# Patient Record
Sex: Female | Born: 1945 | Race: White | Hispanic: No | Smoking: Former smoker
Health system: Southern US, Community
[De-identification: ages and names within clinical notes are randomized; demographics above are authoritative.]

## PROBLEM LIST (undated history)

## (undated) DIAGNOSIS — Z720 Tobacco use: Secondary | ICD-10-CM

## (undated) DIAGNOSIS — I10 Essential (primary) hypertension: Secondary | ICD-10-CM

## (undated) DIAGNOSIS — F419 Anxiety disorder, unspecified: Secondary | ICD-10-CM

## (undated) DIAGNOSIS — F32A Depression, unspecified: Secondary | ICD-10-CM

## (undated) DIAGNOSIS — K219 Gastro-esophageal reflux disease without esophagitis: Secondary | ICD-10-CM

## (undated) DIAGNOSIS — J449 Chronic obstructive pulmonary disease, unspecified: Secondary | ICD-10-CM

## (undated) DIAGNOSIS — M1990 Unspecified osteoarthritis, unspecified site: Secondary | ICD-10-CM

## (undated) DIAGNOSIS — E041 Nontoxic single thyroid nodule: Secondary | ICD-10-CM

## (undated) DIAGNOSIS — I739 Peripheral vascular disease, unspecified: Secondary | ICD-10-CM

## (undated) HISTORY — DX: Essential (primary) hypertension: I10

## (undated) HISTORY — DX: Peripheral vascular disease, unspecified: I73.9

## (undated) HISTORY — DX: Tobacco use: Z72.0

---

## 1997-11-16 ENCOUNTER — Other Ambulatory Visit: Admission: RE | Admit: 1997-11-16 | Discharge: 1997-11-16 | Payer: Self-pay | Admitting: Obstetrics & Gynecology

## 1998-06-25 ENCOUNTER — Ambulatory Visit (HOSPITAL_COMMUNITY): Admission: RE | Admit: 1998-06-25 | Discharge: 1998-06-25 | Payer: Self-pay | Admitting: Family Medicine

## 1999-10-13 ENCOUNTER — Emergency Department (HOSPITAL_COMMUNITY): Admission: EM | Admit: 1999-10-13 | Discharge: 1999-10-14 | Payer: Self-pay | Admitting: Emergency Medicine

## 1999-10-27 ENCOUNTER — Other Ambulatory Visit: Admission: RE | Admit: 1999-10-27 | Discharge: 1999-10-27 | Payer: Self-pay | Admitting: Obstetrics & Gynecology

## 2000-01-20 ENCOUNTER — Encounter: Payer: Self-pay | Admitting: Family Medicine

## 2000-01-20 ENCOUNTER — Ambulatory Visit (HOSPITAL_COMMUNITY): Admission: RE | Admit: 2000-01-20 | Discharge: 2000-01-20 | Payer: Self-pay | Admitting: Family Medicine

## 2000-01-21 ENCOUNTER — Ambulatory Visit (HOSPITAL_COMMUNITY): Admission: RE | Admit: 2000-01-21 | Discharge: 2000-01-21 | Payer: Self-pay | Admitting: Family Medicine

## 2000-01-21 ENCOUNTER — Encounter: Payer: Self-pay | Admitting: Family Medicine

## 2000-11-17 ENCOUNTER — Other Ambulatory Visit: Admission: RE | Admit: 2000-11-17 | Discharge: 2000-11-17 | Payer: Self-pay | Admitting: Obstetrics & Gynecology

## 2002-02-04 ENCOUNTER — Other Ambulatory Visit: Admission: RE | Admit: 2002-02-04 | Discharge: 2002-02-04 | Payer: Self-pay | Admitting: Obstetrics and Gynecology

## 2002-03-14 HISTORY — PX: TRANSTHORACIC ECHOCARDIOGRAM: SHX275

## 2003-02-07 ENCOUNTER — Other Ambulatory Visit: Admission: RE | Admit: 2003-02-07 | Discharge: 2003-02-07 | Payer: Self-pay | Admitting: Obstetrics & Gynecology

## 2004-02-17 ENCOUNTER — Other Ambulatory Visit: Admission: RE | Admit: 2004-02-17 | Discharge: 2004-02-17 | Payer: Self-pay | Admitting: Obstetrics & Gynecology

## 2005-03-01 ENCOUNTER — Other Ambulatory Visit: Admission: RE | Admit: 2005-03-01 | Discharge: 2005-03-01 | Payer: Self-pay | Admitting: Obstetrics & Gynecology

## 2006-06-23 HISTORY — PX: OTHER SURGICAL HISTORY: SHX169

## 2007-07-31 ENCOUNTER — Ambulatory Visit (HOSPITAL_COMMUNITY): Admission: RE | Admit: 2007-07-31 | Discharge: 2007-07-31 | Payer: Self-pay | Admitting: Obstetrics & Gynecology

## 2007-07-31 ENCOUNTER — Encounter (INDEPENDENT_AMBULATORY_CARE_PROVIDER_SITE_OTHER): Payer: Self-pay | Admitting: Obstetrics & Gynecology

## 2007-11-16 ENCOUNTER — Encounter: Admission: RE | Admit: 2007-11-16 | Discharge: 2007-11-16 | Payer: Self-pay | Admitting: Obstetrics & Gynecology

## 2010-05-24 HISTORY — PX: OTHER SURGICAL HISTORY: SHX169

## 2010-12-05 ENCOUNTER — Encounter: Payer: Self-pay | Admitting: Obstetrics & Gynecology

## 2011-03-03 ENCOUNTER — Other Ambulatory Visit: Payer: Self-pay | Admitting: Obstetrics & Gynecology

## 2011-03-03 DIAGNOSIS — R928 Other abnormal and inconclusive findings on diagnostic imaging of breast: Secondary | ICD-10-CM

## 2011-03-07 ENCOUNTER — Ambulatory Visit
Admission: RE | Admit: 2011-03-07 | Discharge: 2011-03-07 | Disposition: A | Discharge status: Home / Self Care | Payer: BC Managed Care – PPO | Source: Ambulatory Visit | Attending: Obstetrics & Gynecology | Admitting: Obstetrics & Gynecology

## 2011-03-07 DIAGNOSIS — R928 Other abnormal and inconclusive findings on diagnostic imaging of breast: Secondary | ICD-10-CM

## 2011-03-29 NOTE — Op Note (Signed)
[name], Kim Higgins NO.:  [id number]   MEDICAL RECORD NO.:  [id number]          PATIENT TYPE:  AMB   LOCATION:  SDC                           FACILITY:  [facility]   PHYSICIAN:  Freddy Finner, M.D.   DATE OF BIRTH:  12-24-1945   DATE OF PROCEDURE:  DATE OF DISCHARGE:                               OPERATIVE REPORT   PREOPERATIVE DIAGNOSES:  Postmenopausal bleeding, endometrial polyp.   POSTOPERATIVE DIAGNOSES:  Postmenopausal bleeding, endometrial polyp.   OPERATIVE PROCEDURE:  Hysteroscopy, D&C and removal of endometrial  polyp.   ANESTHESIA:  General.   INTRAOPERATIVE COMPLICATIONS:  None.   SECONDARY ANESTHESIA:  Paracervical block and IM and IV Toradol.   ESTIMATED BLOOD LOSS:  5 mL.   FLUIDS:  Sorbitol deficit of 45 mL.   COMPLICATIONS:  None.   PROCEDURE IN DETAIL:  The patient is a 65 year old who has had  postmenopausal bleeding and is on hormone replacement therapy.  A  sonohysterogram in the office showed an endometrial polyp.  She is  admitted now for hysteroscopy and D&C.  She was admitted on the morning  of surgery, given a bolus of Ancef IV preoperatively, brought to the  operating room, placed under adequate general anesthesia, placed in the  dorsal lithotomy position using the [facility] stirrup system.  Betadine prep  of the perineum and vagina was carried out in the standard fashion.  Sterile drapes were applied.  Bivalve speculum was introduced.  The  cervix was visualized, grasped on the anterior left with a single tooth  tenaculum.  Paracervical block was placed with injection of  approximately 5 mL at the 4 and 8 o'clock in the vaginal fornices at a  depth of approximately 5 mm along the uterosacral ligaments.  Uterus  sounded to 7 cm.  The cervix was dilated up to 25 with [facility] dilators.  A 12.5 degree hysteroscope was introduced using 3% sorbitol as  distending medium.  Polyp was visualized, photographed.  Gentle thorough  curettage and  exploration we removed the polyp and adequately sampled  the endometrium as confirmed by repeat inspection with the hysteroscope.  The procedure was terminated.  The instruments were removed.  The  patient was awakened and taken  to recovery in good condition.  She will be given routine outpatient  surgical instructions.  She has Vicodin to be taken as needed for  postoperative cramping and pain.  She is to return to the office in  approximately 7 to 10 days for postoperative followup.      Freddy Finner, M.D.  Electronically Signed     WRN/MEDQ  D:  07/31/2007  T:  07/31/2007  Job:  [id number]

## 2011-05-27 HISTORY — PX: OTHER SURGICAL HISTORY: SHX169

## 2011-08-25 LAB — BASIC METABOLIC PANEL
BUN: 12
CO2: 29
Chloride: 104
GFR calc non Af Amer: >60
Glucose, Bld: 104 — ABNORMAL HIGH
Potassium: 4.6
Sodium: 138

## 2011-08-25 LAB — CBC
HCT: 39.9
Hemoglobin: 14
MCV: 97.2
RDW: 13.6

## 2012-10-30 ENCOUNTER — Other Ambulatory Visit (HOSPITAL_COMMUNITY): Payer: Self-pay | Admitting: Cardiovascular Disease

## 2012-10-30 DIAGNOSIS — I739 Peripheral vascular disease, unspecified: Secondary | ICD-10-CM

## 2013-03-22 DIAGNOSIS — H25049 Posterior subcapsular polar age-related cataract, unspecified eye: Secondary | ICD-10-CM | POA: Diagnosis not present

## 2013-03-22 DIAGNOSIS — H52209 Unspecified astigmatism, unspecified eye: Secondary | ICD-10-CM | POA: Diagnosis not present

## 2013-03-22 DIAGNOSIS — H2510 Age-related nuclear cataract, unspecified eye: Secondary | ICD-10-CM | POA: Diagnosis not present

## 2013-04-02 DIAGNOSIS — M549 Dorsalgia, unspecified: Secondary | ICD-10-CM | POA: Diagnosis not present

## 2013-05-24 ENCOUNTER — Ambulatory Visit (HOSPITAL_COMMUNITY)
Admission: RE | Admit: 2013-05-24 | Discharge: 2013-05-24 | Disposition: A | Discharge status: Home / Self Care | Payer: Medicare Other | Source: Ambulatory Visit | Attending: Cardiovascular Disease | Admitting: Cardiovascular Disease

## 2013-05-24 DIAGNOSIS — I739 Peripheral vascular disease, unspecified: Secondary | ICD-10-CM | POA: Diagnosis not present

## 2013-05-24 NOTE — Progress Notes (Signed)
Upper Extremity Arterial Duplex Completed. °Kim Higgins ° °

## 2013-05-28 DIAGNOSIS — H2589 Other age-related cataract: Secondary | ICD-10-CM | POA: Diagnosis not present

## 2013-05-28 DIAGNOSIS — H25049 Posterior subcapsular polar age-related cataract, unspecified eye: Secondary | ICD-10-CM | POA: Diagnosis not present

## 2013-05-28 DIAGNOSIS — H2510 Age-related nuclear cataract, unspecified eye: Secondary | ICD-10-CM | POA: Diagnosis not present

## 2013-06-03 ENCOUNTER — Telehealth: Payer: Self-pay | Admitting: *Deleted

## 2013-06-03 ENCOUNTER — Encounter: Payer: Self-pay | Admitting: *Deleted

## 2013-06-03 DIAGNOSIS — I739 Peripheral vascular disease, unspecified: Secondary | ICD-10-CM

## 2013-06-03 NOTE — Telephone Encounter (Signed)
Message copied by Marella Bile on Mon Jun 03, 2013  2:02 PM ------      Message from: [name]      Created: Thu May 30, 2013 11:00 AM       No change from prior study. Repeat in 24 months. ------

## 2013-07-22 DIAGNOSIS — Z1231 Encounter for screening mammogram for malignant neoplasm of breast: Secondary | ICD-10-CM | POA: Diagnosis not present

## 2013-07-22 DIAGNOSIS — Z124 Encounter for screening for malignant neoplasm of cervix: Secondary | ICD-10-CM | POA: Diagnosis not present

## 2013-07-22 DIAGNOSIS — Z1212 Encounter for screening for malignant neoplasm of rectum: Secondary | ICD-10-CM | POA: Diagnosis not present

## 2013-07-24 DIAGNOSIS — H35359 Cystoid macular degeneration, unspecified eye: Secondary | ICD-10-CM | POA: Diagnosis not present

## 2013-09-11 DIAGNOSIS — H35359 Cystoid macular degeneration, unspecified eye: Secondary | ICD-10-CM | POA: Diagnosis not present

## 2013-09-17 DIAGNOSIS — J209 Acute bronchitis, unspecified: Secondary | ICD-10-CM | POA: Diagnosis not present

## 2013-09-17 DIAGNOSIS — F17200 Nicotine dependence, unspecified, uncomplicated: Secondary | ICD-10-CM | POA: Diagnosis not present

## 2013-10-01 DIAGNOSIS — R0602 Shortness of breath: Secondary | ICD-10-CM | POA: Diagnosis not present

## 2013-10-01 DIAGNOSIS — I10 Essential (primary) hypertension: Secondary | ICD-10-CM | POA: Diagnosis not present

## 2013-10-03 DIAGNOSIS — I10 Essential (primary) hypertension: Secondary | ICD-10-CM | POA: Diagnosis not present

## 2013-10-22 DIAGNOSIS — R0602 Shortness of breath: Secondary | ICD-10-CM | POA: Diagnosis not present

## 2013-10-28 ENCOUNTER — Other Ambulatory Visit: Payer: Self-pay | Admitting: *Deleted

## 2013-10-28 MED ORDER — IRBESARTAN 150 MG PO TABS: 150.0000 mg | ORAL_TABLET | Freq: Every day | ORAL | Status: DC

## 2013-10-28 NOTE — Telephone Encounter (Signed)
Rx was sent to pharmacy electronically. 

## 2013-11-01 DIAGNOSIS — J019 Acute sinusitis, unspecified: Secondary | ICD-10-CM | POA: Diagnosis not present

## 2013-11-01 DIAGNOSIS — J449 Chronic obstructive pulmonary disease, unspecified: Secondary | ICD-10-CM | POA: Diagnosis not present

## 2014-01-01 DIAGNOSIS — J449 Chronic obstructive pulmonary disease, unspecified: Secondary | ICD-10-CM | POA: Diagnosis not present

## 2014-01-01 DIAGNOSIS — F3289 Other specified depressive episodes: Secondary | ICD-10-CM | POA: Diagnosis not present

## 2014-01-01 DIAGNOSIS — L659 Nonscarring hair loss, unspecified: Secondary | ICD-10-CM | POA: Diagnosis not present

## 2014-01-01 DIAGNOSIS — Z23 Encounter for immunization: Secondary | ICD-10-CM | POA: Diagnosis not present

## 2014-01-01 DIAGNOSIS — Z0000 Encounter for general adult medical examination without abnormal findings: Secondary | ICD-10-CM | POA: Diagnosis not present

## 2014-01-01 DIAGNOSIS — F329 Major depressive disorder, single episode, unspecified: Secondary | ICD-10-CM | POA: Diagnosis not present

## 2014-01-01 DIAGNOSIS — I10 Essential (primary) hypertension: Secondary | ICD-10-CM | POA: Diagnosis not present

## 2014-04-02 DIAGNOSIS — H35359 Cystoid macular degeneration, unspecified eye: Secondary | ICD-10-CM | POA: Diagnosis not present

## 2014-04-02 DIAGNOSIS — H43819 Vitreous degeneration, unspecified eye: Secondary | ICD-10-CM | POA: Diagnosis not present

## 2014-05-05 ENCOUNTER — Other Ambulatory Visit: Payer: Self-pay | Admitting: Cardiovascular Disease

## 2014-05-06 NOTE — Telephone Encounter (Signed)
Rx refill sent to patient pharmacy with inst. To make appointment for further refills.

## 2014-06-23 ENCOUNTER — Other Ambulatory Visit: Payer: Self-pay | Admitting: Cardiovascular Disease

## 2014-06-24 ENCOUNTER — Encounter: Payer: Self-pay | Admitting: *Deleted

## 2014-06-24 NOTE — Telephone Encounter (Signed)
Rx was sent to pharmacy electronically. Last OV 03/07/13

## 2014-06-26 ENCOUNTER — Other Ambulatory Visit: Payer: Self-pay | Admitting: *Deleted

## 2014-06-26 MED ORDER — IRBESARTAN 150 MG PO TABS: 150.0000 mg | ORAL_TABLET | Freq: Every day | ORAL | Status: DC

## 2014-07-02 DIAGNOSIS — J209 Acute bronchitis, unspecified: Secondary | ICD-10-CM | POA: Diagnosis not present

## 2014-07-02 DIAGNOSIS — J449 Chronic obstructive pulmonary disease, unspecified: Secondary | ICD-10-CM | POA: Diagnosis not present

## 2014-07-02 DIAGNOSIS — F17200 Nicotine dependence, unspecified, uncomplicated: Secondary | ICD-10-CM | POA: Diagnosis not present

## 2014-07-02 DIAGNOSIS — I10 Essential (primary) hypertension: Secondary | ICD-10-CM | POA: Diagnosis not present

## 2014-07-15 ENCOUNTER — Encounter: Payer: Self-pay | Admitting: *Deleted

## 2014-07-16 ENCOUNTER — Encounter: Payer: Self-pay | Admitting: Cardiology

## 2014-07-16 ENCOUNTER — Ambulatory Visit (INDEPENDENT_AMBULATORY_CARE_PROVIDER_SITE_OTHER): Payer: Medicare Other | Admitting: Cardiology

## 2014-07-16 VITALS — BP 144/74 | HR 71 | Ht 65.0 in | Wt 121.5 lb

## 2014-07-16 DIAGNOSIS — I739 Peripheral vascular disease, unspecified: Secondary | ICD-10-CM | POA: Insufficient documentation

## 2014-07-16 DIAGNOSIS — F17200 Nicotine dependence, unspecified, uncomplicated: Secondary | ICD-10-CM | POA: Diagnosis not present

## 2014-07-16 DIAGNOSIS — I10 Essential (primary) hypertension: Secondary | ICD-10-CM | POA: Diagnosis not present

## 2014-07-16 MED ORDER — IRBESARTAN 150 MG PO TABS: 150.0000 mg | ORAL_TABLET | Freq: Every day | ORAL | Status: DC

## 2014-07-16 MED ORDER — AMLODIPINE BESYLATE 2.5 MG PO TABS: 2.5000 mg | ORAL_TABLET | Freq: Every day | ORAL | Status: DC

## 2014-07-16 NOTE — Assessment & Plan Note (Addendum)
1/4 pk a day or less

## 2014-07-16 NOTE — Progress Notes (Signed)
07/16/2014 Kim Higgins   November 21, 1945  [id number]  Primary Physician Cloyd Stagers, MD Primary Cardiologist: Dr Gwenlyn Found  HPI:  68 y/o female followed by Dr Melvern Banker previously, and Dr Gwenlyn Found since 2010. 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 She had an upper extremity doppler in 2010 that suggested significant Lt SCA stenosis. She had no symptoms and her last doppler in 2012 suggested mild stenosis, < 50%.  She is here for a routine visit. She has a new PCP- Dr Collier Flowers. The pt reports that her B/P has been elevated recently, systolic > 295. She denies any Lt arm pain, SOB, or chest pain.   Current Outpatient Prescriptions  Medication Sig Dispense Refill  . albuterol-ipratropium (COMBIVENT) 18-103 MCG/ACT inhaler Inhale 1 puff into the lungs as needed for wheezing or shortness of breath.      . Ascorbic Acid (VITAMIN C) 1000 MG tablet Take 1,000 mg by mouth daily.      Marland Kitchen b complex vitamins tablet Take 1 tablet by mouth daily.      . Biotin 10 MG CAPS Take 1 capsule by mouth daily.      . Black Currant Seed Oil 500 MG CAPS Take 1 capsule by mouth daily.      Marland Kitchen buPROPion (WELLBUTRIN SR) 150 MG 12 hr tablet Take 150 mg by mouth 2 (two) times daily.      . Cholecalciferol (VITAMIN D-3) 1000 UNITS CAPS Take 1 capsule by mouth daily.      Marland Kitchen estradiol (ESTRACE) 2 MG tablet Take 2 mg by mouth daily.      . Fluticasone-Salmeterol (ADVAIR) 250-50 MCG/DOSE AEPB Inhale 1 puff into the lungs 2 (two) times daily.      . irbesartan (AVAPRO) 150 MG tablet Take 1 tablet (150 mg total) by mouth daily.  30 tablet  5  . Lycopene 10 MG CAPS Take 1 capsule by mouth daily.      . Omega-3 Fatty Acids (FISH OIL) 1200 MG CAPS Take 1 capsule by mouth daily.      Marland Kitchen omeprazole (PRILOSEC) 20 MG capsule Take 20 mg by mouth daily.      . progesterone (PROMETRIUM) 100 MG capsule Take 100 mg by mouth daily. Takes  for first 10 days in every other month      . tiotropium (SPIRIVA HANDIHALER) 18 MCG inhalation capsule Place 18 mcg into inhaler and inhale daily.      . vitamin E 400 UNIT capsule Take 400 Units by mouth daily.      Marland Kitchen amLODipine (NORVASC) 2.5 MG tablet Take 1 tablet (2.5 mg total) by mouth daily.  30 tablet  11   No current facility-administered medications for this visit.    No Known Allergies  History   Social History  . Marital Status: Single    Spouse Name: N/A    Number of Children: N/A  . Years of Education: N/A   Occupational History  . Not on file.   Social History Main Topics  . Smoking status: Current Every Day Smoker -- 0.25 packs/day for 30 years    Types: Cigarettes  . Smokeless tobacco: Not on file  . Alcohol Use: 2.0 oz/week    4 drink(s) per week  . Drug Use: Not on file  . Sexual Activity: Not on file   Other Topics Concern  . Not on file  Social History Narrative  . No narrative on file     Review of Systems: General: negative for chills, fever, night sweats or weight changes.  Cardiovascular: negative for chest pain, dyspnea on exertion, edema, orthopnea, palpitations, paroxysmal nocturnal dyspnea or shortness of breath Dermatological: negative for rash Respiratory: negative for cough or wheezing Urologic: negative for hematuria Abdominal: negative for nausea, vomiting, diarrhea, bright red blood per rectum, melena, or hematemesis Neurologic: negative for visual changes, syncope, or dizziness All other systems reviewed and are otherwise negative except as noted above.    Blood pressure 144/74, pulse 71, height 5\' 5"  (1.651 m), weight 121 lb 8 oz (55.112 kg).  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 She is very reluctant to take addition medication but I got her to agree to try Norvasc 2.5 mg. I discussed her LSCA stenosis with Dr Gwenlyn Found. As she is asymptomatic we will not order a follow up. I did suggest she take a baby ASA daily. Her last LDL was 63 in 2013. Ideally I would consider a low dose statin for plaque stabilization but I could tell she would not be open to this. She'll return in 3 weeks for a B/P check and at that time we'll tackle smoking cessation.   Kadajah Higgins KPA-C 07/16/2014 4:59 PM

## 2014-07-16 NOTE — Patient Instructions (Signed)
Start a daily Aspirin 81mg  Start Amlodipine 2.5mg  Daily   Your physician recommends that you schedule a follow-up appointment in: 3 weeks with Kerin Ransom PA-C

## 2014-07-16 NOTE — Assessment & Plan Note (Signed)
asymptomatic

## 2014-07-16 NOTE — Assessment & Plan Note (Signed)
Her B/P has been running high with systolic > 128

## 2014-07-29 DIAGNOSIS — Z01419 Encounter for gynecological examination (general) (routine) without abnormal findings: Secondary | ICD-10-CM | POA: Diagnosis not present

## 2014-07-29 DIAGNOSIS — Z1231 Encounter for screening mammogram for malignant neoplasm of breast: Secondary | ICD-10-CM | POA: Diagnosis not present

## 2014-08-11 ENCOUNTER — Ambulatory Visit (INDEPENDENT_AMBULATORY_CARE_PROVIDER_SITE_OTHER): Payer: Medicare Other | Admitting: Cardiology

## 2014-08-11 ENCOUNTER — Encounter: Payer: Self-pay | Admitting: Cardiology

## 2014-08-11 VITALS — BP 106/62 | HR 74 | Ht 64.0 in | Wt 120.5 lb

## 2014-08-11 DIAGNOSIS — I739 Peripheral vascular disease, unspecified: Secondary | ICD-10-CM

## 2014-08-11 DIAGNOSIS — F17200 Nicotine dependence, unspecified, uncomplicated: Secondary | ICD-10-CM | POA: Diagnosis not present

## 2014-08-11 DIAGNOSIS — I10 Essential (primary) hypertension: Secondary | ICD-10-CM | POA: Diagnosis not present

## 2014-08-11 NOTE — Assessment & Plan Note (Signed)
1/4 pk a day

## 2014-08-11 NOTE — Assessment & Plan Note (Signed)
No symptoms 

## 2014-08-11 NOTE — Progress Notes (Signed)
08/11/2014 Kim Higgins   Jul 22, 1946  [id number]  Primary Physicia Cloyd Stagers, MD Primary Cardiologist: Dr Gwenlyn Found  HPI:  68 y/o female followed by Dr Melvern Banker previously, and Dr Gwenlyn Found since 2010. 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 She had an upper extremity doppler in 2010 that suggested significant Lt SCA stenosis. She had no symptoms and her last doppler in 2012 suggested mild stenosis, < 50%. She saw me 07/16/14 as a referral from her new PCP- Dr Collier Flowers. The pt reports that her B/P had been elevated recently, systolic > 419. She denies any Lt arm pain, SOB, or chest pain. I added Norvasc 2.5 mg daily and she is here today for a follow up B/P.     Current Outpatient Prescriptions  Medication Sig Dispense Refill  . albuterol-ipratropium (COMBIVENT) 18-103 MCG/ACT inhaler Inhale 1 puff into the lungs as needed for wheezing or shortness of breath.      Marland Kitchen amLODipine (NORVASC) 2.5 MG tablet Take 1 tablet (2.5 mg total) by mouth daily.  30 tablet  11  . Ascorbic Acid (VITAMIN C) 1000 MG tablet Take 1,000 mg by mouth daily.      Marland Kitchen b complex vitamins tablet Take 1 tablet by mouth daily.      . Biotin 10 MG CAPS Take 1 capsule by mouth daily.      . Black Currant Seed Oil 500 MG CAPS Take 1 capsule by mouth daily.      Marland Kitchen buPROPion (WELLBUTRIN SR) 150 MG 12 hr tablet Take 150 mg by mouth 2 (two) times daily.      . Cholecalciferol (VITAMIN D-3) 1000 UNITS CAPS Take 1 capsule by mouth daily.      Marland Kitchen estradiol (ESTRACE) 2 MG tablet Take 2 mg by mouth daily.      . Fluticasone-Salmeterol (ADVAIR) 250-50 MCG/DOSE AEPB Inhale 1 puff into the lungs 2 (two) times daily.      . irbesartan (AVAPRO) 150 MG tablet Take 1 tablet (150 mg total) by mouth daily.  30 tablet  5  . Lycopene 10 MG CAPS Take 1 capsule by mouth daily.      . Omega-3 Fatty Acids (FISH OIL) 1200 MG CAPS Take 1  capsule by mouth daily.      Marland Kitchen omeprazole (PRILOSEC) 20 MG capsule Take 20 mg by mouth daily.      . progesterone (PROMETRIUM) 100 MG capsule Take 100 mg by mouth daily. Takes for first 10 days in every other month      . tiotropium (SPIRIVA HANDIHALER) 18 MCG inhalation capsule Place 18 mcg into inhaler and inhale daily.      . vitamin E 400 UNIT capsule Take 400 Units by mouth daily.       No current facility-administered medications for this visit.    No Known Allergies  History   Social History  . Marital Status: Single    Spouse Name: N/A    Number of Children: N/A  . Years of Education: N/A   Occupational History  . Not on file.   Social History Main Topics  . Smoking status: Current Every Day Smoker -- 0.25 packs/day for 30 years    Types: Cigarettes  . Smokeless tobacco: Not on file  . Alcohol Use: 2.0 oz/week    4 drink(s) per week  . Drug Use: Not on file  . Sexual  Activity: Not on file   Other Topics Concern  . Not on file   Social History Narrative  . No narrative on file     Review of Systems: General: negative for chills, fever, night sweats or weight changes.  Cardiovascular: negative for chest pain, dyspnea on exertion, edema, orthopnea, palpitations, paroxysmal nocturnal dyspnea or shortness of breath Dermatological: negative for rash Respiratory: negative for cough or wheezing Urologic: negative for hematuria Abdominal: negative for nausea, vomiting, diarrhea, bright red blood per rectum, melena, or hematemesis Neurologic: negative for visual changes, syncope, or dizziness All other systems reviewed and are otherwise negative except as noted above.    Blood pressure 106/62, pulse 74, height 5\' 4"  (1.626 m), weight 120 lb 8 oz (54.658 kg).  General appearance: alert, cooperative, appears older than stated age, cachectic and no distress B/P in her Lt arm 82 sytolic by palpation. B/P in her Rt ar 92/60 by cuff.    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 I suggested she take her Avapro at night. She'll keep a record of her B/P at home and let us know how she is doing. We'll check a B/P in a month. If she has orthostatic symptoms or a systolic B/P consistently less than 100 I would probably stop her Amlodipine and settle for slightly higher B/P.   Kim Higgins KPA-C 08/11/2014 12:04 PM

## 2014-08-11 NOTE — Patient Instructions (Signed)
Continue same medications   Your physician recommends that you schedule a follow-up appointment in: 1 month for B/P check Wed 09/10/14 3:50 pm Kim Higgins

## 2014-08-11 NOTE — Assessment & Plan Note (Signed)
Now a little low after addition of low dose Amlodipine

## 2014-08-13 ENCOUNTER — Ambulatory Visit: Payer: Medicare Other | Admitting: Cardiovascular Disease

## 2014-09-10 ENCOUNTER — Encounter: Payer: Self-pay | Admitting: Pharmacist Clinician (PhC)/ Clinical Pharmacy Specialist

## 2014-09-10 ENCOUNTER — Ambulatory Visit (INDEPENDENT_AMBULATORY_CARE_PROVIDER_SITE_OTHER): Payer: Medicare Other | Admitting: Pharmacist Clinician (PhC)/ Clinical Pharmacy Specialist

## 2014-09-10 VITALS — BP 132/84 | HR 72 | Ht 64.0 in | Wt 122.9 lb

## 2014-09-10 DIAGNOSIS — I10 Essential (primary) hypertension: Secondary | ICD-10-CM | POA: Diagnosis not present

## 2014-09-10 NOTE — Progress Notes (Signed)
09/10/2014 PSALM ARMAN [birth year] [id number]   HPI:  Kim Higgins is a 68 y.o. female patient of Dr. Gwenlyn Found, with a PMH below who presents today for hypertension clinic evaluation.  Her cardiac history is significant for PVD and hypertension.  She has been taking irbesartan for a year or more, then added amlodipine in early September.  She then started having low pressures, when she saw Kerin Ransom on 9/28 her pressure was 106/62.  He had her switch her irbesartan to evenings to see if this would avoid her daytime low readings.  She reports home readings from 734-193 systolic, with only 2 readings >140.    She is a current smoker, although she would like to quit.  Right now a pack of cigarettes is lasting her close to 1 week.  She drinks 1-2 glasses of wine and cups of coffee each day.  She follows a healthy diet and is very conscientious of salt and packaged foods.  Her exercise consists of walking 20-30 minutes per day and going to the gym at least twice per week.  She spoke of trying to keep her weight steady, not wanting to get "heavy" again.  When asked, she stated that several years ago she weighed 136 lbs and she doesn't want to weigh that much again.     Current Outpatient Prescriptions  Medication Sig Dispense Refill  . albuterol-ipratropium (COMBIVENT) 18-103 MCG/ACT inhaler Inhale 1 puff into the lungs as needed for wheezing or shortness of breath.      Marland Kitchen amLODipine (NORVASC) 2.5 MG tablet Take 1 tablet (2.5 mg total) by mouth daily.  30 tablet  11  . Ascorbic Acid (VITAMIN C) 1000 MG tablet Take 1,000 mg by mouth daily.      Marland Kitchen b complex vitamins tablet Take 1 tablet by mouth daily.      . Biotin 10 MG CAPS Take 1 capsule by mouth daily.      . Black Currant Seed Oil 500 MG CAPS Take 1 capsule by mouth daily.      Marland Kitchen buPROPion (WELLBUTRIN SR) 150 MG 12 hr tablet Take 150 mg by mouth 2 (two) times daily.      . Cholecalciferol (VITAMIN D-3) 1000 UNITS CAPS Take 1 capsule by mouth  daily.      Marland Kitchen estradiol (ESTRACE) 2 MG tablet Take 2 mg by mouth daily.      . Fluticasone-Salmeterol (ADVAIR) 250-50 MCG/DOSE AEPB Inhale 1 puff into the lungs 2 (two) times daily.      . irbesartan (AVAPRO) 150 MG tablet Take 1 tablet (150 mg total) by mouth daily.  30 tablet  5  . Lycopene 10 MG CAPS Take 1 capsule by mouth daily.      . Omega-3 Fatty Acids (FISH OIL) 1200 MG CAPS Take 1 capsule by mouth daily.      Marland Kitchen omeprazole (PRILOSEC) 20 MG capsule Take 20 mg by mouth daily.      . progesterone (PROMETRIUM) 100 MG capsule Take 100 mg by mouth daily. Takes for first 10 days in every other month      . tiotropium (SPIRIVA HANDIHALER) 18 MCG inhalation capsule Place 18 mcg into inhaler and inhale daily.      . vitamin E 400 UNIT capsule Take 400 Units by mouth daily.       No current facility-administered medications for this visit.    No Known Allergies  Past Medical History  Diagnosis Date  . Tobacco abuse   .  Hypertension   . PVD (peripheral vascular disease)     asymptomatic, mild LSCA stenosis    Blood pressure 132/84, pulse 72, height 5\' 4"  (1.626 m), weight 122 lb 14.4 oz (55.747 kg).   ASSESSMENT AND PLAN:  Today her BP looks good at 132/84.  She is feeling good and has no complaints.  I will go ahead and discontinue her amlodipine for now, but I cautioned her not to throw it away just yet.  She is to continue with home BP checks several times each week and I will see her again in 2 months.  I asked that she call in earlier if she sees her pressure trending to >150 on a regular basis, as we will have to restart the amlodipine if this happens.   Tommy Medal PharmD CPP [facility]

## 2014-09-10 NOTE — Patient Instructions (Signed)
Return for a a follow up appointment in 2 months  Your blood pressure today is 132/84  Check your blood pressure at home several times each week and keep record of the readings.  Take your BP meds as follows - continue with irbesartan once daily, discontinue amlodipine for now  Bring all of your meds, your BP cuff and your record of home blood pressures to your next appointment.  Exercise as you're able, try to walk approximately 30 minutes per day.  Keep salt intake to a minimum, especially watch canned and prepared boxed foods.  Eat more fresh fruits and vegetables and fewer canned items.  Avoid eating in fast food restaurants.    HOW TO TAKE YOUR BLOOD PRESSURE:   Rest 5 minutes before taking your blood pressure.    Don't smoke or drink caffeinated beverages for at least 30 minutes before.   Take your blood pressure before (not after) you eat.   Sit comfortably with your back supported and both feet on the floor (don't cross your legs).   Elevate your arm to heart level on a table or a desk.   Use the proper sized cuff. It should fit smoothly and snugly around your bare upper arm. There should be enough room to slip a fingertip under the cuff. The bottom edge of the cuff should be 1 inch above the crease of the elbow.   Ideally, take 3 measurements at one sitting and record the average.

## 2014-10-27 ENCOUNTER — Encounter: Payer: Medicare Other | Admitting: Pharmacist Clinician (PhC)/ Clinical Pharmacy Specialist

## 2014-11-26 ENCOUNTER — Ambulatory Visit (INDEPENDENT_AMBULATORY_CARE_PROVIDER_SITE_OTHER): Payer: Medicare Other | Admitting: Pharmacist Clinician (PhC)/ Clinical Pharmacy Specialist

## 2014-11-26 ENCOUNTER — Encounter: Payer: Self-pay | Admitting: Pharmacist Clinician (PhC)/ Clinical Pharmacy Specialist

## 2014-11-26 VITALS — BP 140/70 | HR 68 | Ht 64.0 in | Wt 123.5 lb

## 2014-11-26 DIAGNOSIS — I10 Essential (primary) hypertension: Secondary | ICD-10-CM

## 2014-11-26 NOTE — Progress Notes (Signed)
11/26/2014 Kim Higgins 1946/05/30 [id number]   HPI:  Kim Higgins is a 69 y.o. female patient of Dr. Gwenlyn Found, with a PMH below who presents today for hypertension clinic follow up.  Her cardiac history is significant for PVD and hypertension.  She has been taking irbesartan for a year or more, then added amlodipine in early September.  She then started having low pressures, when she saw Kerin Ransom on 9/28 her pressure was 106/62.  He had her switch her irbesartan to evenings to see if this would avoid her daytime low readings.  She reports home readings from 831-517 systolic, with only 2 readings >140.   We stopped the amlodipine at that time.  Today she brings in about 15 readings over the past 3 months.  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 They run 108-141/62-83.  Only one reading at 141, all others 135 or lower  On a positive note, she quit smoking sometime last fall.  She states it was in September, but I saw her in late October and she was planning to quit at any time.  Has been on Chantix, only 1 cigarette since then.  She drinks 1-2 glasses of wine and cups of coffee each day.  She follows a healthy diet and is very conscientious of salt and packaged foods.  Her exercise consists of walking 20-30 minutes per day and going to the gym at least twice per week.  She spoke of trying to keep her weight steady, not wanting to get "heavy" again.  When asked, she stated that several years ago she weighed 136 lbs and she doesn't want to weigh that much again.     Current Outpatient Prescriptions  Medication Sig Dispense Refill  . albuterol-ipratropium (COMBIVENT) 18-103 MCG/ACT inhaler Inhale 1 puff into the lungs as needed for wheezing or shortness of breath.    Marland Kitchen amLODipine (NORVASC) 2.5 MG tablet Take 1 tablet (2.5 mg total) by mouth daily. 30 tablet 11  . Ascorbic Acid (VITAMIN C) 1000 MG tablet Take 1,000 mg by mouth daily.    Marland Kitchen b complex vitamins tablet  Take 1 tablet by mouth daily.    . Biotin 10 MG CAPS Take 1 capsule by mouth daily.    . Black Currant Seed Oil 500 MG CAPS Take 1 capsule by mouth daily.    Marland Kitchen buPROPion (WELLBUTRIN SR) 150 MG 12 hr tablet Take 150 mg by mouth 2 (two) times daily.    Hendricks Limes CONTINUING MONTH PAK 1 MG tablet Take 1 mg by mouth 2 (two) times daily.  0  . Cholecalciferol (VITAMIN D-3) 1000 UNITS CAPS Take 1 capsule by mouth daily.    Marland Kitchen estradiol (ESTRACE) 2 MG tablet Take 2 mg by mouth daily.    . Fluticasone-Salmeterol (ADVAIR) 250-50 MCG/DOSE AEPB Inhale 1 puff into the lungs 2 (two) times daily.    . irbesartan (AVAPRO) 150 MG tablet Take 1 tablet (150 mg total) by mouth daily. 30 tablet 5  . Lycopene 10 MG CAPS Take 1 capsule by mouth daily.    . Omega-3 Fatty Acids (FISH OIL) 1200 MG CAPS Take 1 capsule by mouth daily.    Marland Kitchen omeprazole (PRILOSEC) 20 MG capsule Take 20 mg by mouth daily.    . progesterone (PROMETRIUM) 100 MG capsule Take 100 mg by mouth daily. Takes for first 10 days in every other month    . tiotropium (SPIRIVA HANDIHALER) 18 MCG inhalation  capsule Place 18 mcg into inhaler and inhale daily.    . vitamin E 400 UNIT capsule Take 400 Units by mouth daily.     No current facility-administered medications for this visit.    No Known Allergies  Past Medical History  Diagnosis Date  . Tobacco abuse   . Hypertension   . PVD (peripheral vascular disease)     asymptomatic, mild LSCA stenosis    Blood pressure 140/70, pulse 68, height 5\' 4"  (1.626 m), weight 123 lb 8 oz (56.019 kg).   ASSESSMENT AND PLAN:  Today her BP looks stable at 140/70 on the right arm.  She is feeling good and has no complaints.  She is to continue with home BP checks several times each week and let us know if the readings start to trend toward 347 systolic.  Tommy Medal PharmD CPP [facility]

## 2014-11-26 NOTE — Patient Instructions (Signed)
Your blood pressure today is 128/72   Check your blood pressure at home several times each month and keep record of the readings.  Take your BP meds as follows: continue with irbesartan 150 mg daily  Bring all of your meds, your BP cuff and your record of home blood pressures to your next appointment.  Exercise as you're able, try to walk approximately 30 minutes per day.  Keep salt intake to a minimum, especially watch canned and prepared boxed foods.  Eat more fresh fruits and vegetables and fewer canned items.  Avoid eating in fast food restaurants.    HOW TO TAKE YOUR BLOOD PRESSURE: . Rest 5 minutes before taking your blood pressure. .  Don't smoke or drink caffeinated beverages for at least 30 minutes before. . Take your blood pressure before (not after) you eat. . Sit comfortably with your back supported and both feet on the floor (don't cross your legs). . Elevate your arm to heart level on a table or a desk. . Use the proper sized cuff. It should fit smoothly and snugly around your bare upper arm. There should be enough room to slip a fingertip under the cuff. The bottom edge of the cuff should be 1 inch above the crease of the elbow. . Ideally, take 3 measurements at one sitting and record the average.

## 2014-11-27 ENCOUNTER — Ambulatory Visit
Admission: RE | Admit: 2014-11-27 | Discharge: 2014-11-27 | Disposition: A | Discharge status: Home / Self Care | Payer: Medicare Other | Source: Ambulatory Visit | Attending: Internal Medicine | Admitting: Internal Medicine

## 2014-11-27 ENCOUNTER — Other Ambulatory Visit: Payer: Self-pay | Admitting: Internal Medicine

## 2014-11-27 DIAGNOSIS — E041 Nontoxic single thyroid nodule: Secondary | ICD-10-CM | POA: Diagnosis not present

## 2014-11-27 DIAGNOSIS — J449 Chronic obstructive pulmonary disease, unspecified: Secondary | ICD-10-CM | POA: Diagnosis not present

## 2014-11-27 DIAGNOSIS — R490 Dysphonia: Secondary | ICD-10-CM | POA: Diagnosis not present

## 2014-12-01 ENCOUNTER — Other Ambulatory Visit: Payer: Self-pay | Admitting: Internal Medicine

## 2014-12-01 DIAGNOSIS — E041 Nontoxic single thyroid nodule: Secondary | ICD-10-CM

## 2014-12-18 DIAGNOSIS — E041 Nontoxic single thyroid nodule: Secondary | ICD-10-CM | POA: Diagnosis not present

## 2015-01-07 DIAGNOSIS — Z1389 Encounter for screening for other disorder: Secondary | ICD-10-CM | POA: Diagnosis not present

## 2015-01-07 DIAGNOSIS — E041 Nontoxic single thyroid nodule: Secondary | ICD-10-CM | POA: Diagnosis not present

## 2015-01-07 DIAGNOSIS — F329 Major depressive disorder, single episode, unspecified: Secondary | ICD-10-CM | POA: Diagnosis not present

## 2015-01-07 DIAGNOSIS — F17290 Nicotine dependence, other tobacco product, uncomplicated: Secondary | ICD-10-CM | POA: Diagnosis not present

## 2015-01-07 DIAGNOSIS — R42 Dizziness and giddiness: Secondary | ICD-10-CM | POA: Diagnosis not present

## 2015-01-07 DIAGNOSIS — R490 Dysphonia: Secondary | ICD-10-CM | POA: Diagnosis not present

## 2015-01-07 DIAGNOSIS — I10 Essential (primary) hypertension: Secondary | ICD-10-CM | POA: Diagnosis not present

## 2015-01-07 DIAGNOSIS — Z0000 Encounter for general adult medical examination without abnormal findings: Secondary | ICD-10-CM | POA: Diagnosis not present

## 2015-01-07 DIAGNOSIS — J449 Chronic obstructive pulmonary disease, unspecified: Secondary | ICD-10-CM | POA: Diagnosis not present

## 2015-01-26 ENCOUNTER — Other Ambulatory Visit: Payer: Self-pay | Admitting: *Deleted

## 2015-01-26 MED ORDER — IRBESARTAN 150 MG PO TABS: 150.0000 mg | ORAL_TABLET | Freq: Every day | ORAL | Status: DC

## 2015-04-03 DIAGNOSIS — H2512 Age-related nuclear cataract, left eye: Secondary | ICD-10-CM | POA: Diagnosis not present

## 2015-04-03 DIAGNOSIS — Z961 Presence of intraocular lens: Secondary | ICD-10-CM | POA: Diagnosis not present

## 2015-04-15 ENCOUNTER — Other Ambulatory Visit: Payer: Self-pay | Admitting: Obstetrics and Gynecology

## 2015-04-15 DIAGNOSIS — N644 Mastodynia: Secondary | ICD-10-CM | POA: Diagnosis not present

## 2015-04-17 ENCOUNTER — Ambulatory Visit
Admission: RE | Admit: 2015-04-17 | Discharge: 2015-04-17 | Disposition: A | Discharge status: Home / Self Care | Payer: Medicare Other | Source: Ambulatory Visit | Attending: Obstetrics and Gynecology | Admitting: Obstetrics and Gynecology

## 2015-04-17 ENCOUNTER — Other Ambulatory Visit: Payer: Self-pay | Admitting: Obstetrics and Gynecology

## 2015-04-17 DIAGNOSIS — N644 Mastodynia: Secondary | ICD-10-CM

## 2015-04-30 ENCOUNTER — Other Ambulatory Visit: Payer: Self-pay | Admitting: *Deleted

## 2015-04-30 MED ORDER — IRBESARTAN 150 MG PO TABS: 150.0000 mg | ORAL_TABLET | Freq: Every day | ORAL | Status: DC

## 2015-05-29 ENCOUNTER — Other Ambulatory Visit: Payer: Self-pay | Admitting: Cardiovascular Disease

## 2015-05-29 DIAGNOSIS — I739 Peripheral vascular disease, unspecified: Secondary | ICD-10-CM

## 2015-06-03 ENCOUNTER — Ambulatory Visit: Payer: Medicare Other | Admitting: Cardiovascular Disease

## 2015-06-10 ENCOUNTER — Other Ambulatory Visit (HOSPITAL_COMMUNITY): Payer: Medicare Other

## 2015-06-15 DIAGNOSIS — T8543XD Leakage of breast prosthesis and implant, subsequent encounter: Secondary | ICD-10-CM | POA: Diagnosis not present

## 2015-07-07 ENCOUNTER — Ambulatory Visit (HOSPITAL_COMMUNITY)
Admission: RE | Admit: 2015-07-07 | Discharge: 2015-07-07 | Disposition: A | Discharge status: Home / Self Care | Payer: Medicare Other | Source: Ambulatory Visit | Attending: Cardiovascular Disease | Admitting: Cardiovascular Disease

## 2015-07-07 DIAGNOSIS — I10 Essential (primary) hypertension: Secondary | ICD-10-CM | POA: Insufficient documentation

## 2015-07-07 DIAGNOSIS — I739 Peripheral vascular disease, unspecified: Secondary | ICD-10-CM | POA: Diagnosis not present

## 2015-07-07 DIAGNOSIS — I708 Atherosclerosis of other arteries: Secondary | ICD-10-CM | POA: Diagnosis not present

## 2015-07-08 DIAGNOSIS — B370 Candidal stomatitis: Secondary | ICD-10-CM | POA: Diagnosis not present

## 2015-07-08 DIAGNOSIS — I10 Essential (primary) hypertension: Secondary | ICD-10-CM | POA: Diagnosis not present

## 2015-07-08 DIAGNOSIS — J392 Other diseases of pharynx: Secondary | ICD-10-CM | POA: Diagnosis not present

## 2015-07-08 DIAGNOSIS — R490 Dysphonia: Secondary | ICD-10-CM | POA: Diagnosis not present

## 2015-07-08 DIAGNOSIS — J449 Chronic obstructive pulmonary disease, unspecified: Secondary | ICD-10-CM | POA: Diagnosis not present

## 2015-07-08 DIAGNOSIS — I771 Stricture of artery: Secondary | ICD-10-CM | POA: Diagnosis not present

## 2015-07-13 ENCOUNTER — Other Ambulatory Visit: Payer: Self-pay | Admitting: Nurse Practitioner

## 2015-07-13 ENCOUNTER — Ambulatory Visit: Payer: Medicare Other

## 2015-07-13 ENCOUNTER — Ambulatory Visit
Admission: RE | Admit: 2015-07-13 | Discharge: 2015-07-13 | Disposition: A | Discharge status: Home / Self Care | Payer: Medicare Other | Source: Ambulatory Visit | Attending: Nurse Practitioner | Admitting: Nurse Practitioner

## 2015-07-13 DIAGNOSIS — R197 Diarrhea, unspecified: Secondary | ICD-10-CM

## 2015-07-14 ENCOUNTER — Encounter: Payer: Self-pay | Admitting: Cardiovascular Disease

## 2015-07-14 ENCOUNTER — Ambulatory Visit (INDEPENDENT_AMBULATORY_CARE_PROVIDER_SITE_OTHER): Payer: Medicare Other | Admitting: Cardiovascular Disease

## 2015-07-14 DIAGNOSIS — R197 Diarrhea, unspecified: Secondary | ICD-10-CM | POA: Diagnosis not present

## 2015-07-14 DIAGNOSIS — I10 Essential (primary) hypertension: Secondary | ICD-10-CM

## 2015-07-14 MED ORDER — IRBESARTAN 150 MG PO TABS: 150.0000 mg | ORAL_TABLET | Freq: Every day | ORAL | Status: DC

## 2015-07-14 NOTE — Progress Notes (Signed)
07/14/2015 Audry Riles   Feb 28, 1946  [id number]  Primary Physician Shamleffer, Herschell Dimes, MD Primary Cardiologist: Lorretta Harp MD Renae Gloss   HPI:  Ms . Chatwin returns for follow-up. I probably have seen her 4-5 years ago. She saw Kerin Ransom in the office 08/11/14. She was remotely a patient of Dr. Golden Hurter. 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 She has a history of hypertension, discontinued tobacco abuse and mild left subclavian artery stenosis. She denies chest pain but does get shortness of breath from COPD improved on inhaled bronchodilators.   Current Outpatient Prescriptions  Medication Sig Dispense Refill  . albuterol-ipratropium (COMBIVENT) 18-103 MCG/ACT inhaler Inhale 1 puff into the lungs as needed for wheezing or shortness of breath.    . Ascorbic Acid (VITAMIN C) 1000 MG tablet Take 1,000 mg by mouth daily.    Marland Kitchen b complex vitamins tablet Take 1 tablet by mouth daily.    . Biotin 10 MG CAPS Take 1 capsule by mouth daily.    . Black Currant Seed Oil 500 MG CAPS Take 1 capsule by mouth daily.    Marland Kitchen buPROPion (WELLBUTRIN SR) 150 MG 12 hr tablet Take 150 mg by mouth 2 (two) times daily.    . Cholecalciferol (VITAMIN D-3) 1000 UNITS CAPS Take 1 capsule by mouth daily.    Marland Kitchen estradiol (ESTRACE) 2 MG tablet Take 2 mg by mouth daily.    . Fluticasone-Salmeterol (ADVAIR) 250-50 MCG/DOSE AEPB Inhale 1 puff into the lungs daily.     . irbesartan (AVAPRO) 150 MG tablet Take 1 tablet (150 mg total) by mouth daily. 30 tablet 2  . Lycopene 10 MG CAPS Take 1 capsule by mouth daily.    . Omega-3 Fatty Acids (FISH OIL) 1200 MG CAPS Take 1 capsule by mouth daily.    Marland Kitchen omeprazole (PRILOSEC) 20 MG capsule Take 20 mg by mouth daily.    . progesterone (PROMETRIUM) 100 MG capsule Take 100 mg by mouth daily. Takes for first 10 days in every other month    . vitamin E 400 UNIT capsule Take 400 Units by mouth daily.     No current  facility-administered medications for this visit.    Allergies  Allergen Reactions  . Nystatin Diarrhea    Social History   Social History  . Marital Status: Single    Spouse Name: N/A  . Number of Children: N/A  . Years of Education: N/A   Occupational History  . Not on file.   Social History Main Topics  . Smoking status: Former Smoker -- 0.00 packs/day for 30 years    Types: Cigarettes    Quit date: 08/14/2014  . Smokeless tobacco: Not on file  . Alcohol Use: 2.0 oz/week    4 Standard drinks or equivalent per week  . Drug Use: Not on file  . Sexual Activity: Not on file   Other Topics Concern  . Not on file   Social History Narrative     Review of Systems: General: negative for chills, fever, night sweats or weight changes.  Cardiovascular: negative for chest pain, dyspnea on exertion, edema, orthopnea, palpitations, paroxysmal nocturnal dyspnea or shortness of breath Dermatological: negative for rash Respiratory: negative for cough or wheezing Urologic: negative for hematuria Abdominal: negative for nausea, vomiting, diarrhea, bright red blood per rectum, melena, or hematemesis Neurologic: negative for visual changes, syncope, or dizziness All other systems reviewed and are otherwise negative except as noted above.  Blood pressure 148/92, pulse 81, height 5\' 4"  (1.626 m), weight 127 lb (57.607 kg).  General appearance: alert and no distress Neck: no adenopathy, no JVD, supple, symmetrical, trachea midline, thyroid not enlarged, symmetric, no tenderness/mass/nodules and soft left carotid and subclavian bruit Lungs: clear to auscultation bilaterally Heart: regular rate and rhythm, S1, S2 normal, no murmur, click, rub or gallop Extremities: extremities normal, atraumatic, no cyanosis or edema  EKG normal sinus rhythm 81 without ST or T-wave changes. I personally reviewed this EKG  ASSESSMENT AND PLAN:   PVD (peripheral vascular disease)- 50% LSCA stenosis  by doppler 2012, History of mild left subclavian artery stenosis by duplex ultrasound although she is asymptomatic      Lorretta Harp MD [facility], [facility] 07/14/2015 4:59 PM

## 2015-07-14 NOTE — Assessment & Plan Note (Signed)
History of mild left subclavian artery stenosis by duplex ultrasound although she is asymptomatic

## 2015-07-14 NOTE — Patient Instructions (Signed)
  We will see you back in follow up in 1 year with Dr Gwenlyn Found.   Dr Gwenlyn Found has ordered: 1. Carotid Duplex- This test is an ultrasound of the carotid arteries in your neck. It looks at blood flow through these arteries that supply the brain with blood. Allow one hour for this exam. There are no restrictions or special instructions.

## 2015-07-16 DIAGNOSIS — Z1211 Encounter for screening for malignant neoplasm of colon: Secondary | ICD-10-CM | POA: Diagnosis not present

## 2015-07-16 DIAGNOSIS — R159 Full incontinence of feces: Secondary | ICD-10-CM | POA: Diagnosis not present

## 2015-07-16 DIAGNOSIS — R194 Change in bowel habit: Secondary | ICD-10-CM | POA: Diagnosis not present

## 2015-07-16 DIAGNOSIS — K921 Melena: Secondary | ICD-10-CM | POA: Diagnosis not present

## 2015-07-21 ENCOUNTER — Other Ambulatory Visit: Payer: Self-pay | Admitting: Cardiovascular Disease

## 2015-07-21 DIAGNOSIS — R0989 Other specified symptoms and signs involving the circulatory and respiratory systems: Secondary | ICD-10-CM

## 2015-07-21 DIAGNOSIS — I10 Essential (primary) hypertension: Secondary | ICD-10-CM

## 2015-07-27 ENCOUNTER — Ambulatory Visit (HOSPITAL_COMMUNITY)
Admission: RE | Admit: 2015-07-27 | Discharge: 2015-07-27 | Disposition: A | Discharge status: Home / Self Care | Payer: Medicare Other | Source: Ambulatory Visit | Attending: Cardiovascular Disease | Admitting: Cardiovascular Disease

## 2015-07-27 DIAGNOSIS — F17200 Nicotine dependence, unspecified, uncomplicated: Secondary | ICD-10-CM | POA: Diagnosis not present

## 2015-07-27 DIAGNOSIS — R0989 Other specified symptoms and signs involving the circulatory and respiratory systems: Secondary | ICD-10-CM | POA: Diagnosis not present

## 2015-07-27 DIAGNOSIS — I10 Essential (primary) hypertension: Secondary | ICD-10-CM | POA: Insufficient documentation

## 2015-07-27 DIAGNOSIS — I6523 Occlusion and stenosis of bilateral carotid arteries: Secondary | ICD-10-CM | POA: Diagnosis not present

## 2015-07-27 DIAGNOSIS — I739 Peripheral vascular disease, unspecified: Secondary | ICD-10-CM | POA: Diagnosis not present

## 2015-07-30 ENCOUNTER — Other Ambulatory Visit: Payer: Self-pay

## 2015-07-30 DIAGNOSIS — R0989 Other specified symptoms and signs involving the circulatory and respiratory systems: Secondary | ICD-10-CM

## 2015-07-30 DIAGNOSIS — I739 Peripheral vascular disease, unspecified: Secondary | ICD-10-CM

## 2015-08-03 DIAGNOSIS — Z1211 Encounter for screening for malignant neoplasm of colon: Secondary | ICD-10-CM | POA: Diagnosis not present

## 2015-08-03 DIAGNOSIS — K5730 Diverticulosis of large intestine without perforation or abscess without bleeding: Secondary | ICD-10-CM | POA: Diagnosis not present

## 2015-08-11 ENCOUNTER — Other Ambulatory Visit: Payer: Self-pay | Admitting: Obstetrics & Gynecology

## 2015-08-11 DIAGNOSIS — Z1231 Encounter for screening mammogram for malignant neoplasm of breast: Secondary | ICD-10-CM | POA: Diagnosis not present

## 2015-08-11 DIAGNOSIS — N760 Acute vaginitis: Secondary | ICD-10-CM | POA: Diagnosis not present

## 2015-08-11 DIAGNOSIS — Z01419 Encounter for gynecological examination (general) (routine) without abnormal findings: Secondary | ICD-10-CM | POA: Diagnosis not present

## 2015-08-11 DIAGNOSIS — Z124 Encounter for screening for malignant neoplasm of cervix: Secondary | ICD-10-CM | POA: Diagnosis not present

## 2015-08-11 DIAGNOSIS — Z6821 Body mass index (BMI) 21.0-21.9, adult: Secondary | ICD-10-CM | POA: Diagnosis not present

## 2015-08-12 DIAGNOSIS — R197 Diarrhea, unspecified: Secondary | ICD-10-CM | POA: Diagnosis not present

## 2015-08-12 LAB — CYTOLOGY - PAP

## 2015-09-08 DIAGNOSIS — R490 Dysphonia: Secondary | ICD-10-CM | POA: Diagnosis not present

## 2015-09-08 DIAGNOSIS — J387 Other diseases of larynx: Secondary | ICD-10-CM | POA: Diagnosis not present

## 2015-09-08 DIAGNOSIS — I10 Essential (primary) hypertension: Secondary | ICD-10-CM | POA: Diagnosis not present

## 2015-09-08 DIAGNOSIS — Z87891 Personal history of nicotine dependence: Secondary | ICD-10-CM | POA: Diagnosis not present

## 2015-09-08 DIAGNOSIS — E041 Nontoxic single thyroid nodule: Secondary | ICD-10-CM | POA: Diagnosis not present

## 2016-01-13 DIAGNOSIS — J069 Acute upper respiratory infection, unspecified: Secondary | ICD-10-CM | POA: Diagnosis not present

## 2016-02-18 DIAGNOSIS — I10 Essential (primary) hypertension: Secondary | ICD-10-CM | POA: Diagnosis not present

## 2016-02-18 DIAGNOSIS — E041 Nontoxic single thyroid nodule: Secondary | ICD-10-CM | POA: Diagnosis not present

## 2016-02-18 DIAGNOSIS — M25511 Pain in right shoulder: Secondary | ICD-10-CM | POA: Diagnosis not present

## 2016-02-18 DIAGNOSIS — I771 Stricture of artery: Secondary | ICD-10-CM | POA: Diagnosis not present

## 2016-02-18 DIAGNOSIS — Z0000 Encounter for general adult medical examination without abnormal findings: Secondary | ICD-10-CM | POA: Diagnosis not present

## 2016-02-18 DIAGNOSIS — Z1389 Encounter for screening for other disorder: Secondary | ICD-10-CM | POA: Diagnosis not present

## 2016-02-18 DIAGNOSIS — R490 Dysphonia: Secondary | ICD-10-CM | POA: Diagnosis not present

## 2016-02-18 DIAGNOSIS — J449 Chronic obstructive pulmonary disease, unspecified: Secondary | ICD-10-CM | POA: Diagnosis not present

## 2016-03-02 DIAGNOSIS — M542 Cervicalgia: Secondary | ICD-10-CM | POA: Diagnosis not present

## 2016-03-02 DIAGNOSIS — M7581 Other shoulder lesions, right shoulder: Secondary | ICD-10-CM | POA: Diagnosis not present

## 2016-04-06 DIAGNOSIS — H2512 Age-related nuclear cataract, left eye: Secondary | ICD-10-CM | POA: Diagnosis not present

## 2016-04-06 DIAGNOSIS — Z0100 Encounter for examination of eyes and vision without abnormal findings: Secondary | ICD-10-CM | POA: Diagnosis not present

## 2016-04-06 DIAGNOSIS — Z961 Presence of intraocular lens: Secondary | ICD-10-CM | POA: Diagnosis not present

## 2016-06-13 ENCOUNTER — Ambulatory Visit
Admission: RE | Admit: 2016-06-13 | Discharge: 2016-06-13 | Disposition: A | Discharge status: Home / Self Care | Payer: Medicare Other | Source: Ambulatory Visit | Attending: Internal Medicine | Admitting: Internal Medicine

## 2016-06-13 ENCOUNTER — Other Ambulatory Visit: Payer: Self-pay | Admitting: Internal Medicine

## 2016-06-13 DIAGNOSIS — I10 Essential (primary) hypertension: Secondary | ICD-10-CM | POA: Diagnosis not present

## 2016-06-13 DIAGNOSIS — M25551 Pain in right hip: Secondary | ICD-10-CM | POA: Diagnosis not present

## 2016-06-13 DIAGNOSIS — S76311A Strain of muscle, fascia and tendon of the posterior muscle group at thigh level, right thigh, initial encounter: Secondary | ICD-10-CM | POA: Diagnosis not present

## 2016-06-13 DIAGNOSIS — R52 Pain, unspecified: Secondary | ICD-10-CM

## 2016-06-13 DIAGNOSIS — J449 Chronic obstructive pulmonary disease, unspecified: Secondary | ICD-10-CM | POA: Diagnosis not present

## 2016-06-13 DIAGNOSIS — M5136 Other intervertebral disc degeneration, lumbar region: Secondary | ICD-10-CM | POA: Diagnosis not present

## 2016-06-27 ENCOUNTER — Other Ambulatory Visit: Payer: Self-pay | Admitting: Internal Medicine

## 2016-06-27 DIAGNOSIS — M4726 Other spondylosis with radiculopathy, lumbar region: Secondary | ICD-10-CM | POA: Diagnosis not present

## 2016-06-27 DIAGNOSIS — M545 Low back pain: Secondary | ICD-10-CM

## 2016-06-27 DIAGNOSIS — I739 Peripheral vascular disease, unspecified: Secondary | ICD-10-CM | POA: Diagnosis not present

## 2016-06-27 DIAGNOSIS — I771 Stricture of artery: Secondary | ICD-10-CM | POA: Diagnosis not present

## 2016-06-27 DIAGNOSIS — M5431 Sciatica, right side: Secondary | ICD-10-CM | POA: Diagnosis not present

## 2016-07-05 ENCOUNTER — Ambulatory Visit
Admission: RE | Admit: 2016-07-05 | Discharge: 2016-07-05 | Disposition: A | Discharge status: Home / Self Care | Payer: Medicare Other | Source: Ambulatory Visit | Attending: Internal Medicine | Admitting: Internal Medicine

## 2016-07-05 DIAGNOSIS — M5416 Radiculopathy, lumbar region: Secondary | ICD-10-CM | POA: Diagnosis not present

## 2016-07-05 DIAGNOSIS — M4806 Spinal stenosis, lumbar region: Secondary | ICD-10-CM | POA: Diagnosis not present

## 2016-07-05 DIAGNOSIS — M545 Low back pain: Secondary | ICD-10-CM | POA: Diagnosis not present

## 2016-07-06 ENCOUNTER — Encounter: Payer: Self-pay | Admitting: Cardiovascular Disease

## 2016-07-06 ENCOUNTER — Ambulatory Visit (INDEPENDENT_AMBULATORY_CARE_PROVIDER_SITE_OTHER): Payer: Medicare Other | Admitting: Cardiovascular Disease

## 2016-07-06 DIAGNOSIS — I10 Essential (primary) hypertension: Secondary | ICD-10-CM | POA: Diagnosis not present

## 2016-07-06 DIAGNOSIS — I739 Peripheral vascular disease, unspecified: Secondary | ICD-10-CM | POA: Diagnosis not present

## 2016-07-06 MED ORDER — IRBESARTAN 150 MG PO TABS: 150.0000 mg | ORAL_TABLET | Freq: Every day | ORAL | 3 refills | Status: DC

## 2016-07-06 NOTE — Progress Notes (Signed)
07/06/2016 Kim Higgins   [birth year]  [id number]  Primary Physician Ileana Roup, MD Primary Cardiologist: Lorretta Harp MD Renae Gloss  HPI:  Ms . Higgins is a 70 year old female who I last saw in the office 07/14/15. She was remotely a patient of Dr. Golden Hurter. 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 She has a history of hypertension, discontinued tobacco abuse and mild left subclavian artery stenosis. She denies chest pain but does get shortness of breath from COPD improved on inhaled bronchodilators. Since I saw her year ago she denies chest pain or shortness of breath.   Current Outpatient Prescriptions  Medication Sig Dispense Refill  . albuterol-ipratropium (COMBIVENT) 18-103 MCG/ACT inhaler Inhale 1 puff into the lungs as needed for wheezing or shortness of breath.    . ASCORBIC ACID PO Take 350 mg by mouth daily.    Marland Kitchen b complex vitamins tablet Take 1 tablet by mouth daily.    . Biotin 10 MG CAPS Take 1 capsule by mouth daily.    Marland Kitchen buPROPion (WELLBUTRIN SR) 150 MG 12 hr tablet Take 150 mg by mouth 2 (two) times daily.    . Cholecalciferol (VITAMIN D-3) 1000 UNITS CAPS Take 1 capsule by mouth daily.    Marland Kitchen estradiol (ESTRACE) 2 MG tablet Take 2 mg by mouth daily.    . Fluticasone-Salmeterol (ADVAIR) 250-50 MCG/DOSE AEPB Inhale 1 puff into the lungs daily.     Marland Kitchen gabapentin (NEURONTIN) 300 MG capsule Take 300 mg by mouth daily. 1 TABLET AT NIGHT FOR 1 WEEK.    . irbesartan (AVAPRO) 150 MG tablet Take 1 tablet (150 mg total) by mouth daily. 90 tablet 3  . naproxen (NAPROSYN) 500 MG tablet Take 500 mg by mouth 2 (two) times daily with a meal.    . Omega-3 Fatty Acids (FISH OIL) 1200 MG CAPS Take 1 capsule by mouth daily.    Marland Kitchen omeprazole (PRILOSEC) 20 MG capsule Take 20 mg by mouth daily.    . progesterone (PROMETRIUM) 100 MG capsule Take 100 mg by mouth daily. Takes for first 10 days in every other month    . vitamin E 400 UNIT capsule Take 400  Units by mouth daily.     No current facility-administered medications for this visit.     Allergies  Allergen Reactions  . Nystatin Diarrhea    Social History   Social History  . Marital status: Single    Spouse name: N/A  . Number of children: N/A  . Years of education: N/A   Occupational History  . Not on file.   Social History Main Topics  . Smoking status: Former Smoker    Packs/day: 0.00    Years: 30.00    Types: Cigarettes    Quit date: 08/14/2014  . Smokeless tobacco: Never Used  . Alcohol use 2.0 oz/week    4 Standard drinks or equivalent per week  . Drug use: Unknown  . Sexual activity: Not on file   Other Topics Concern  . Not on file   Social History Narrative  . No narrative on file     Review of Systems: General: negative for chills, fever, night sweats or weight changes.  Cardiovascular: negative for chest pain, dyspnea on exertion, edema, orthopnea, palpitations, paroxysmal nocturnal dyspnea or shortness of breath Dermatological: negative for rash Respiratory: negative for cough or wheezing Urologic: negative for hematuria Abdominal: negative for nausea, vomiting, diarrhea, bright red blood per rectum,  melena, or hematemesis Neurologic: negative for visual changes, syncope, or dizziness All other systems reviewed and are otherwise negative except as noted above.    Blood pressure 131/76, pulse 82, height 5\' 4"  (1.626 m), weight 131 lb (59.4 kg).  General appearance: alert and no distress Neck: no adenopathy, no carotid bruit, no JVD, supple, symmetrical, trachea midline and thyroid not enlarged, symmetric, no tenderness/mass/nodules Lungs: clear to auscultation bilaterally Heart: regular rate and rhythm, S1, S2 normal, no murmur, click, rub or gallop Extremities: extremities normal, atraumatic, no cyanosis or edema  EKG normal sinus rhythm 82 without ST or T-wave changes. There were septal Q waves. I personally reviewed this EKG  ASSESSMENT  AND PLAN:   HTN (hypertension) History of hypertension blood pressure measured today at 131/76. She is on Avapro. Continue current meds at current dosing  PVD (peripheral vascular disease)- 50% LSCA stenosis by doppler 2012, His history of mild left subclavian artery stenosis. Most recent carotid Doppler study performed 07/27/15 did not demonstrate this.      Lorretta Harp MD FACP,FACC,FAHA, [facility] 07/06/2016 2:37 PM

## 2016-07-06 NOTE — Patient Instructions (Signed)

## 2016-07-06 NOTE — Assessment & Plan Note (Signed)
His history of mild left subclavian artery stenosis. Most recent carotid Doppler study performed 07/27/15 did not demonstrate this.

## 2016-07-06 NOTE — Assessment & Plan Note (Signed)
History of hypertension blood pressure measured today at 131/76. She is on Avapro. Continue current meds at current dosing

## 2016-07-26 DIAGNOSIS — M545 Low back pain: Secondary | ICD-10-CM | POA: Diagnosis not present

## 2016-07-26 DIAGNOSIS — M5416 Radiculopathy, lumbar region: Secondary | ICD-10-CM | POA: Diagnosis not present

## 2016-08-02 DIAGNOSIS — R262 Difficulty in walking, not elsewhere classified: Secondary | ICD-10-CM | POA: Diagnosis not present

## 2016-08-02 DIAGNOSIS — M5416 Radiculopathy, lumbar region: Secondary | ICD-10-CM | POA: Diagnosis not present

## 2016-08-04 DIAGNOSIS — R262 Difficulty in walking, not elsewhere classified: Secondary | ICD-10-CM | POA: Diagnosis not present

## 2016-08-04 DIAGNOSIS — M5416 Radiculopathy, lumbar region: Secondary | ICD-10-CM | POA: Diagnosis not present

## 2016-08-08 DIAGNOSIS — M5416 Radiculopathy, lumbar region: Secondary | ICD-10-CM | POA: Diagnosis not present

## 2016-08-08 DIAGNOSIS — R262 Difficulty in walking, not elsewhere classified: Secondary | ICD-10-CM | POA: Diagnosis not present

## 2016-08-11 DIAGNOSIS — M5416 Radiculopathy, lumbar region: Secondary | ICD-10-CM | POA: Diagnosis not present

## 2016-08-11 DIAGNOSIS — R262 Difficulty in walking, not elsewhere classified: Secondary | ICD-10-CM | POA: Diagnosis not present

## 2016-08-24 DIAGNOSIS — Z6823 Body mass index (BMI) 23.0-23.9, adult: Secondary | ICD-10-CM | POA: Diagnosis not present

## 2016-08-24 DIAGNOSIS — Z1231 Encounter for screening mammogram for malignant neoplasm of breast: Secondary | ICD-10-CM | POA: Diagnosis not present

## 2016-08-24 DIAGNOSIS — Z01419 Encounter for gynecological examination (general) (routine) without abnormal findings: Secondary | ICD-10-CM | POA: Diagnosis not present

## 2016-08-29 DIAGNOSIS — M7582 Other shoulder lesions, left shoulder: Secondary | ICD-10-CM | POA: Diagnosis not present

## 2016-08-29 DIAGNOSIS — M47812 Spondylosis without myelopathy or radiculopathy, cervical region: Secondary | ICD-10-CM | POA: Diagnosis not present

## 2016-09-13 DIAGNOSIS — M5416 Radiculopathy, lumbar region: Secondary | ICD-10-CM | POA: Diagnosis not present

## 2016-09-13 DIAGNOSIS — M47812 Spondylosis without myelopathy or radiculopathy, cervical region: Secondary | ICD-10-CM | POA: Diagnosis not present

## 2016-09-21 DIAGNOSIS — J449 Chronic obstructive pulmonary disease, unspecified: Secondary | ICD-10-CM | POA: Diagnosis not present

## 2016-09-21 DIAGNOSIS — M4726 Other spondylosis with radiculopathy, lumbar region: Secondary | ICD-10-CM | POA: Diagnosis not present

## 2016-09-21 DIAGNOSIS — M5431 Sciatica, right side: Secondary | ICD-10-CM | POA: Diagnosis not present

## 2016-11-01 DIAGNOSIS — L821 Other seborrheic keratosis: Secondary | ICD-10-CM | POA: Diagnosis not present

## 2016-11-01 DIAGNOSIS — L570 Actinic keratosis: Secondary | ICD-10-CM | POA: Diagnosis not present

## 2016-12-06 DIAGNOSIS — L821 Other seborrheic keratosis: Secondary | ICD-10-CM | POA: Diagnosis not present

## 2016-12-06 DIAGNOSIS — L570 Actinic keratosis: Secondary | ICD-10-CM | POA: Diagnosis not present

## 2016-12-19 DIAGNOSIS — J069 Acute upper respiratory infection, unspecified: Secondary | ICD-10-CM | POA: Diagnosis not present

## 2016-12-19 DIAGNOSIS — R51 Headache: Secondary | ICD-10-CM | POA: Diagnosis not present

## 2017-02-24 DIAGNOSIS — Z1389 Encounter for screening for other disorder: Secondary | ICD-10-CM | POA: Diagnosis not present

## 2017-02-24 DIAGNOSIS — M4726 Other spondylosis with radiculopathy, lumbar region: Secondary | ICD-10-CM | POA: Diagnosis not present

## 2017-02-24 DIAGNOSIS — F418 Other specified anxiety disorders: Secondary | ICD-10-CM | POA: Diagnosis not present

## 2017-02-24 DIAGNOSIS — J449 Chronic obstructive pulmonary disease, unspecified: Secondary | ICD-10-CM | POA: Diagnosis not present

## 2017-02-24 DIAGNOSIS — F17290 Nicotine dependence, other tobacco product, uncomplicated: Secondary | ICD-10-CM | POA: Diagnosis not present

## 2017-02-24 DIAGNOSIS — I771 Stricture of artery: Secondary | ICD-10-CM | POA: Diagnosis not present

## 2017-02-24 DIAGNOSIS — Z0000 Encounter for general adult medical examination without abnormal findings: Secondary | ICD-10-CM | POA: Diagnosis not present

## 2017-02-24 DIAGNOSIS — M5431 Sciatica, right side: Secondary | ICD-10-CM | POA: Diagnosis not present

## 2017-02-24 DIAGNOSIS — I10 Essential (primary) hypertension: Secondary | ICD-10-CM | POA: Diagnosis not present

## 2017-02-24 DIAGNOSIS — Z780 Asymptomatic menopausal state: Secondary | ICD-10-CM | POA: Diagnosis not present

## 2017-02-24 DIAGNOSIS — I739 Peripheral vascular disease, unspecified: Secondary | ICD-10-CM | POA: Diagnosis not present

## 2017-04-05 DIAGNOSIS — H5202 Hypermetropia, left eye: Secondary | ICD-10-CM | POA: Diagnosis not present

## 2017-04-05 DIAGNOSIS — H2512 Age-related nuclear cataract, left eye: Secondary | ICD-10-CM | POA: Diagnosis not present

## 2017-04-07 DIAGNOSIS — T148XXA Other injury of unspecified body region, initial encounter: Secondary | ICD-10-CM | POA: Diagnosis not present

## 2017-04-07 DIAGNOSIS — L089 Local infection of the skin and subcutaneous tissue, unspecified: Secondary | ICD-10-CM | POA: Diagnosis not present

## 2017-04-07 DIAGNOSIS — I739 Peripheral vascular disease, unspecified: Secondary | ICD-10-CM | POA: Diagnosis not present

## 2017-04-07 DIAGNOSIS — S81812A Laceration without foreign body, left lower leg, initial encounter: Secondary | ICD-10-CM | POA: Diagnosis not present

## 2017-04-12 DIAGNOSIS — L03116 Cellulitis of left lower limb: Secondary | ICD-10-CM | POA: Diagnosis not present

## 2017-04-14 DIAGNOSIS — I10 Essential (primary) hypertension: Secondary | ICD-10-CM | POA: Diagnosis not present

## 2017-04-14 DIAGNOSIS — L03116 Cellulitis of left lower limb: Secondary | ICD-10-CM | POA: Diagnosis not present

## 2017-05-09 DIAGNOSIS — H2512 Age-related nuclear cataract, left eye: Secondary | ICD-10-CM | POA: Diagnosis not present

## 2017-05-09 DIAGNOSIS — H25812 Combined forms of age-related cataract, left eye: Secondary | ICD-10-CM | POA: Diagnosis not present

## 2017-05-25 DIAGNOSIS — L218 Other seborrheic dermatitis: Secondary | ICD-10-CM | POA: Diagnosis not present

## 2017-06-06 DIAGNOSIS — D101 Benign neoplasm of tongue: Secondary | ICD-10-CM | POA: Diagnosis not present

## 2017-06-12 DIAGNOSIS — B370 Candidal stomatitis: Secondary | ICD-10-CM | POA: Diagnosis not present

## 2017-06-14 ENCOUNTER — Ambulatory Visit: Payer: Medicare Other | Admitting: Podiatry

## 2017-06-19 DIAGNOSIS — B370 Candidal stomatitis: Secondary | ICD-10-CM | POA: Diagnosis not present

## 2017-07-05 ENCOUNTER — Encounter: Payer: Self-pay | Admitting: Podiatry

## 2017-07-05 ENCOUNTER — Ambulatory Visit (INDEPENDENT_AMBULATORY_CARE_PROVIDER_SITE_OTHER): Payer: Medicare Other

## 2017-07-05 ENCOUNTER — Ambulatory Visit (INDEPENDENT_AMBULATORY_CARE_PROVIDER_SITE_OTHER): Payer: Medicare Other | Admitting: Podiatry

## 2017-07-05 VITALS — BP 145/78 | HR 71 | Resp 16

## 2017-07-05 DIAGNOSIS — M2041 Other hammer toe(s) (acquired), right foot: Secondary | ICD-10-CM

## 2017-07-05 DIAGNOSIS — D169 Benign neoplasm of bone and articular cartilage, unspecified: Secondary | ICD-10-CM

## 2017-07-05 DIAGNOSIS — M2010 Hallux valgus (acquired), unspecified foot: Secondary | ICD-10-CM

## 2017-07-05 NOTE — Progress Notes (Signed)
   Subjective:    Patient ID: Kim Higgins, female    DOB: 11-06-1946, 71 y.o.   MRN: [id number]  HPI Chief Complaint  Patient presents with  . Foot Pain    Right foot; Bunion; pt stated, "Had bunion surgery on Left foot in 2007; bunion is hurting foot and wants to discuss surgery"  . Painful lesion    Right foot; great toe-lateral side; 2nd toe-medial side      Review of Systems  Hematological: Bruises/bleeds easily.  All other systems reviewed and are negative.      Objective:   Physical Exam        Assessment & Plan:

## 2017-07-05 NOTE — Patient Instructions (Signed)

## 2017-07-06 NOTE — Progress Notes (Signed)
Subjective:    Patient ID: Kim Higgins, female   DOB: 71 y.o.   MRN: [id number]   HPI patient presents stating she had surgery on her left foot that did well and she wants her bunion fixed on her right foot states that it's been sore and she has a spur on the inside of her second toe which is been sore also. Very pleased with left foot states the right foot been going on for a long time    Review of Systems  All other systems reviewed and are negative.       Objective:  Physical Exam  Constitutional: She appears well-developed and well-nourished.  Cardiovascular: Intact distal pulses.   Musculoskeletal: Normal range of motion.  Neurological: She is alert.  Skin: Skin is warm.  Nursing note and vitals reviewed.  neurovascular status intact muscle strength adequate range of motion within normal limits with patient found to have hyperostosis medial aspect first metatarsal head right with redness around the joint surface and is noted to have good digital perfusion. Patient has keratotic lesion distal medial aspect digit 2 right that's painful with pressure between the hallux and second toe part of the problem. 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 I recommended a distal osteotomy explaining we may not get full correction but it should give her a good long-term adequate correction with possible exostectomy. Patient wants surgery and wants to do this but is going to [place] at the end of October and we will wait until after that event. Today I did go ahead and I padded the area and she'll be seen back in mid October to go over in greater details tenably scheduled for surgery  X-rays indicate there is structural bunion deformity right with elevation of the  intermetatarsal angle and the left shows a well corrected bunion deformity with good alignment and joint congruence

## 2017-07-11 ENCOUNTER — Telehealth: Payer: Self-pay | Admitting: *Deleted

## 2017-07-11 NOTE — Telephone Encounter (Signed)
I have a surgical date scheduled tentatively for November 13 with Dr. Paulla Dolly.  I was calling to check on the time.  If you would, give me a call."

## 2017-07-13 NOTE — Telephone Encounter (Signed)
I left the patient a message that someone from the surgical center would call her the Friday or Monday prior to surgery date and let her know the arrival time.  I informed her the reason they call so late is because they may have cancellations or Diabetic patients or children that have to have surgery first.  I asked her to call if she has further questions.

## 2017-07-14 ENCOUNTER — Telehealth: Payer: Self-pay | Admitting: *Deleted

## 2017-07-14 NOTE — Telephone Encounter (Signed)
"  I am calling about a surgery scheduled for the 13th of November.  This is the second message I have left.  If you'd call me back, I'd greatly appreciate it.  Thank you."

## 2017-07-25 ENCOUNTER — Encounter: Payer: Self-pay | Admitting: Cardiovascular Disease

## 2017-07-25 ENCOUNTER — Ambulatory Visit (INDEPENDENT_AMBULATORY_CARE_PROVIDER_SITE_OTHER): Payer: Medicare Other | Admitting: Cardiovascular Disease

## 2017-07-25 VITALS — BP 136/86 | HR 73 | Ht 64.0 in | Wt 128.0 lb

## 2017-07-25 DIAGNOSIS — R0989 Other specified symptoms and signs involving the circulatory and respiratory systems: Secondary | ICD-10-CM

## 2017-07-25 DIAGNOSIS — I739 Peripheral vascular disease, unspecified: Secondary | ICD-10-CM | POA: Diagnosis not present

## 2017-07-25 DIAGNOSIS — I10 Essential (primary) hypertension: Secondary | ICD-10-CM | POA: Diagnosis not present

## 2017-07-25 MED ORDER — IRBESARTAN 150 MG PO TABS: 150.0000 mg | ORAL_TABLET | Freq: Every day | ORAL | 3 refills | Status: DC

## 2017-07-25 NOTE — Assessment & Plan Note (Signed)
History of mild internal carotid artery stenosis and mild left subclavian stenosis a symptomatic. She does have a left carotid bruit. Will follow-up carotid Doppler studies.

## 2017-07-25 NOTE — Progress Notes (Signed)
07/25/2017 Kim Higgins   [birth year]  [id number]  Primary Physician Leeroy Cha, MD Primary Cardiologist: Lorretta Harp MD Lupe Carney, [place]  HPI:  Kim Higgins is a 71 y.o. female  female who I last saw in the office 07/06/16 . She was remotely a patient of Dr. Golden Hurter. 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 She has a history of hypertension, discontinued tobacco abuse and mild left subclavian artery stenosis. She denies chest pain but does get shortness of breath from COPD improved on inhaled bronchodilators. Since I saw her year ago she denies chest pain or shortness of breath. Recent blood work performed by her PCP a total cholesterol of 154, LDL 64 and HDL of 80.   Current Meds  Medication Sig  . albuterol-ipratropium (COMBIVENT) 18-103 MCG/ACT inhaler Inhale 1 puff into the lungs as needed for wheezing or shortness of breath.  . ASCORBIC ACID PO Take 350 mg by mouth daily.  Marland Kitchen b complex vitamins tablet Take 1 tablet by mouth daily.  . Biotin 10 MG CAPS Take 1 capsule by mouth daily. Pt takes 2500 mcg  . buPROPion (WELLBUTRIN SR) 150 MG 12 hr tablet Take 300 mg by mouth daily.   . Cholecalciferol (VITAMIN D-3) 1000 UNITS CAPS Take 1 capsule by mouth daily.  Marland Kitchen estradiol (ESTRACE) 2 MG tablet Take 2 mg by mouth daily.  . Fluticasone-Salmeterol (ADVAIR) 250-50 MCG/DOSE AEPB Inhale 1 puff into the lungs daily.   . irbesartan (AVAPRO) 150 MG tablet Take 1 tablet (150 mg total) by mouth daily.  . Multiple Vitamins-Minerals (MULTIVITAMIN ADULT PO) Take 1 tablet by mouth daily. Women's Over 32  . Omega-3 Fatty Acids (FISH OIL) 1200 MG CAPS Take 1 capsule by mouth daily.  Marland Kitchen omeprazole (PRILOSEC) 20 MG capsule Take 20 mg by mouth daily.  . progesterone (PROMETRIUM) 100 MG capsule Take 100 mg by mouth daily. Takes for first 10 days in every other month  . vitamin E 400 UNIT capsule Take 400 Units by mouth daily.  . [DISCONTINUED] irbesartan (AVAPRO)  150 MG tablet Take 1 tablet (150 mg total) by mouth daily.     Allergies  Allergen Reactions  . Nystatin Diarrhea    Social History   Social History  . Marital status: Single    Spouse name: N/A  . Number of children: N/A  . Years of education: N/A   Occupational History  . Not on file.   Social History Main Topics  . Smoking status: Former Smoker    Packs/day: 0.00    Years: 30.00    Types: Cigarettes    Quit date: 08/14/2014  . Smokeless tobacco: Never Used  . Alcohol use 2.0 oz/week    4 Standard drinks or equivalent per week  . Drug use: Unknown  . Sexual activity: Not on file   Other Topics Concern  . Not on file   Social History Narrative  . No narrative on file     Review of Systems: General: negative for chills, fever, night sweats or weight changes.  Cardiovascular: negative for chest pain, dyspnea on exertion, edema, orthopnea, palpitations, paroxysmal nocturnal dyspnea or shortness of breath Dermatological: negative for rash Respiratory: negative for cough or wheezing Urologic: negative for hematuria Abdominal: negative for nausea, vomiting, diarrhea, bright red blood per rectum, melena, or hematemesis Neurologic: negative for visual changes, syncope, or dizziness All other systems reviewed and are otherwise negative except as noted above.  Blood pressure 136/86, pulse 73, height 5\' 4"  (1.626 m), weight 128 lb (58.1 kg).  General appearance: alert and no distress Neck: no adenopathy, no JVD, supple, symmetrical, trachea midline, thyroid not enlarged, symmetric, no tenderness/mass/nodules and Left carotid and subclavian bruit Lungs: clear to auscultation bilaterally Heart: regular rate and rhythm, S1, S2 normal, no murmur, click, rub or gallop Extremities: extremities normal, atraumatic, no cyanosis or edema  EKG sinus rhythm at 73 without ST or T-wave changes. I personally reviewed this EKG  ASSESSMENT AND PLAN:   HTN (hypertension) History of  essential hypertension blood pressure measures 136/86. She is on Avapro. Continued current meds at current dosing  PVD (peripheral vascular disease)- 50% LSCA stenosis by doppler 2012, History of mild internal carotid artery stenosis and mild left subclavian stenosis a symptomatic. She does have a left carotid bruit. Will follow-up carotid Doppler studies.      Lorretta Harp MD FACP,FACC,FAHA, [facility] 07/25/2017 4:58 PM

## 2017-07-25 NOTE — Telephone Encounter (Signed)
I attempted to return her call.  I left her a message that I would call her tomorrow.

## 2017-07-25 NOTE — Assessment & Plan Note (Signed)
History of essential hypertension blood pressure measures 136/86. She is on Avapro. Continued current meds at current dosing

## 2017-07-25 NOTE — Patient Instructions (Signed)
Medication Instructions: Your physician recommends that you continue on your current medications as directed. Please refer to the Current Medication list given to you today.  Testing/Procedures: Your physician has requested that you have a carotid duplex. This test is an ultrasound of the carotid arteries in your neck. It looks at blood flow through these arteries that supply the brain with blood. Allow one hour for this exam. There are no restrictions or special instructions.  Follow-Up: Your physician wants you to follow-up in: 1 year with Dr. Berry. You will receive a reminder letter in the mail two months in advance. If you don't receive a letter, please call our office to schedule the follow-up appointment.  If you need a refill on your cardiac medications before your next appointment, please call your pharmacy.  

## 2017-07-28 NOTE — Telephone Encounter (Signed)
I'm returning your call.  How can I help you?  "I am scheduled for surgery on November 13.  I don't know what time I need to be there."  You will receive a call from someone from [facility] the Friday or Monday before surgery date.  They will give you the arrival time when they call.  "I am trying to make arrangements for someone to drive me.  It's hard to make arrangements not know a time until 4 pm the day before surgery.  Is there no other way I can find out?"  The reason we don't give you a time is because the surgical center may have cancellations, or they may have Diabetic patients or children that may have to go first.  I'll give you the number if you like, it's on the back of the surgical center brochure that was given to you."  I'll just look at the brochure instead of writing it down.

## 2017-08-25 ENCOUNTER — Ambulatory Visit (INDEPENDENT_AMBULATORY_CARE_PROVIDER_SITE_OTHER): Payer: Medicare Other | Admitting: Podiatry

## 2017-08-25 DIAGNOSIS — D3610 Benign neoplasm of peripheral nerves and autonomic nervous system, unspecified: Secondary | ICD-10-CM

## 2017-08-25 DIAGNOSIS — M2010 Hallux valgus (acquired), unspecified foot: Secondary | ICD-10-CM | POA: Diagnosis not present

## 2017-08-25 DIAGNOSIS — D169 Benign neoplasm of bone and articular cartilage, unspecified: Secondary | ICD-10-CM | POA: Diagnosis not present

## 2017-08-25 DIAGNOSIS — M2041 Other hammer toe(s) (acquired), right foot: Secondary | ICD-10-CM

## 2017-08-25 NOTE — Progress Notes (Signed)
Subjective:    Patient ID: Kim Higgins, female   DOB: 71 y.o.   MRN: [id number]   HPI patient presents stating I'm ready to get this right foot fixed and I'm also getting a lot of burning and shooting between my metatarsals and that bothers me to. Patient states she does not want her toes views like she did on the other foot but she knows that she needs some done with for this very painful lesion that she has    ROS      Objective:  Physical Exam neurovascular status intact with patient found to have significant structural deformity with large bunion deformity right a keratotic lesion between the hallux second toe elevation of the second digit and pain in the third interspace right with a small palpable nodule within the third interspace. 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 At this point I have recommended bunionectomy right with osteotomy explaining may not get full correction but clinically it should be good for her and arthroplasty digit 2 right exostectomy hallux right and exploration third interspace right foot. She reviewed consent form going over alternative treatments complications understands surgery and risk signs consent form after extensive review. Patient is dispensed air fracture walker that I want her to get used to prior to surgery and she will wear 1 shoe on the other foot in order to be loaded properly. Patient is encouraged to call with any questions prior to the procedure

## 2017-08-25 NOTE — Patient Instructions (Signed)
Pre-Operative Instructions  Congratulations, you have decided to take an important step towards improving your quality of life.  You can be assured that the doctors and staff at [facility] will be with you every step of the way.  Here are some important things you should know:  1. Plan to be at the surgery center/hospital at least 1 (one) hour prior to your scheduled time, unless otherwise directed by the surgical center/hospital staff.  You must have a responsible adult accompany you, remain during the surgery and drive you home.  Make sure you have directions to the surgical center/hospital to ensure you arrive on time. 2. If you are having surgery at [facility] or [facility] hospitals, you will need a copy of your medical history and physical form from your family physician within one month prior to the date of surgery. We will give you a form for your primary physician to complete.  3. We make every effort to accommodate the date you request for surgery.  However, there are times where surgery dates or times have to be moved.  We will contact you as soon as possible if a change in schedule is required.   4. No aspirin/ibuprofen for one week before surgery.  If you are on aspirin, any non-steroidal anti-inflammatory medications (Mobic, Aleve, Ibuprofen) should not be taken seven (7) days prior to your surgery.  You make take Tylenol for pain prior to surgery.  5. Medications - If you are taking daily heart and blood pressure medications, seizure, reflux, allergy, asthma, anxiety, pain or diabetes medications, make sure you notify the surgery center/hospital before the day of surgery so they can tell you which medications you should take or avoid the day of surgery. 6. No food or drink after midnight the night before surgery unless directed otherwise by surgical center/hospital staff. 7. No alcoholic beverages 24-hours prior to surgery.  No smoking 24-hours prior or 24-hours after  surgery. 8. Wear loose pants or shorts. They should be loose enough to fit over bandages, boots, and casts. 9. Don't wear slip-on shoes. Sneakers are preferred. 10. Bring your boot with you to the surgery center/hospital.  Also bring crutches or a walker if your physician has prescribed it for you.  If you do not have this equipment, it will be provided for you after surgery. 11. If you have not been contacted by the surgery center/hospital by the day before your surgery, call to confirm the date and time of your surgery. 12. Leave-time from work may vary depending on the type of surgery you have.  Appropriate arrangements should be made prior to surgery with your employer. 13. Prescriptions will be provided immediately following surgery by your doctor.  Fill these as soon as possible after surgery and take the medication as directed. Pain medications will not be refilled on weekends and must be approved by the doctor. 14. Remove nail polish on the operative foot and avoid getting pedicures prior to surgery. 15. Wash the night before surgery.  The night before surgery wash the foot and leg well with water and the antibacterial soap provided. Be sure to pay special attention to beneath the toenails and in between the toes.  Wash for at least three (3) minutes. Rinse thoroughly with water and dry well with a towel.  Perform this wash unless told not to do so by your physician.  Enclosed: 1 Ice pack (please put in freezer the night before surgery)   1 Hibiclens skin cleaner     Pre-op instructions  If you have any questions regarding the instructions, please do not hesitate to call our office.  [place]: [address], [place], [place] [address] -- [phone]  [place]: [address] [place], [place] [address] -- [phone]  [place]: [address]  [place], [place] [address] -- [phone]  [place]: [address], [address], [place], [place] [address] -- [phone]  Website:  [email or web address] 

## 2017-08-30 ENCOUNTER — Ambulatory Visit: Payer: Medicare Other | Admitting: Podiatry

## 2017-09-13 DIAGNOSIS — Z124 Encounter for screening for malignant neoplasm of cervix: Secondary | ICD-10-CM | POA: Diagnosis not present

## 2017-09-13 DIAGNOSIS — Z6822 Body mass index (BMI) 22.0-22.9, adult: Secondary | ICD-10-CM | POA: Diagnosis not present

## 2017-09-13 DIAGNOSIS — N958 Other specified menopausal and perimenopausal disorders: Secondary | ICD-10-CM | POA: Diagnosis not present

## 2017-09-13 DIAGNOSIS — R351 Nocturia: Secondary | ICD-10-CM | POA: Diagnosis not present

## 2017-09-13 DIAGNOSIS — Z1231 Encounter for screening mammogram for malignant neoplasm of breast: Secondary | ICD-10-CM | POA: Diagnosis not present

## 2017-09-15 ENCOUNTER — Other Ambulatory Visit: Payer: Self-pay | Admitting: Obstetrics & Gynecology

## 2017-09-15 DIAGNOSIS — R928 Other abnormal and inconclusive findings on diagnostic imaging of breast: Secondary | ICD-10-CM

## 2017-09-19 ENCOUNTER — Ambulatory Visit
Admission: RE | Admit: 2017-09-19 | Discharge: 2017-09-19 | Disposition: A | Discharge status: Home / Self Care | Payer: Medicare Other | Source: Ambulatory Visit | Attending: Obstetrics & Gynecology | Admitting: Obstetrics & Gynecology

## 2017-09-19 ENCOUNTER — Other Ambulatory Visit: Payer: Self-pay | Admitting: Obstetrics & Gynecology

## 2017-09-19 DIAGNOSIS — R922 Inconclusive mammogram: Secondary | ICD-10-CM | POA: Diagnosis not present

## 2017-09-19 DIAGNOSIS — R928 Other abnormal and inconclusive findings on diagnostic imaging of breast: Secondary | ICD-10-CM

## 2017-09-19 DIAGNOSIS — N6001 Solitary cyst of right breast: Secondary | ICD-10-CM | POA: Diagnosis not present

## 2017-09-26 ENCOUNTER — Encounter: Payer: Self-pay | Admitting: Podiatry

## 2017-09-26 DIAGNOSIS — G5761 Lesion of plantar nerve, right lower limb: Secondary | ICD-10-CM | POA: Diagnosis not present

## 2017-09-26 DIAGNOSIS — M2011 Hallux valgus (acquired), right foot: Secondary | ICD-10-CM | POA: Diagnosis not present

## 2017-09-26 DIAGNOSIS — D1631 Benign neoplasm of short bones of right lower limb: Secondary | ICD-10-CM | POA: Diagnosis not present

## 2017-09-26 DIAGNOSIS — M2041 Other hammer toe(s) (acquired), right foot: Secondary | ICD-10-CM | POA: Diagnosis not present

## 2017-09-26 DIAGNOSIS — I10 Essential (primary) hypertension: Secondary | ICD-10-CM | POA: Diagnosis not present

## 2017-09-26 DIAGNOSIS — M25774 Osteophyte, right foot: Secondary | ICD-10-CM | POA: Diagnosis not present

## 2017-09-28 ENCOUNTER — Ambulatory Visit (INDEPENDENT_AMBULATORY_CARE_PROVIDER_SITE_OTHER): Payer: Medicare Other | Admitting: Podiatry

## 2017-09-28 ENCOUNTER — Encounter: Payer: Self-pay | Admitting: Podiatry

## 2017-09-28 VITALS — BP 135/70 | HR 77 | Temp 96.1°F | Resp 16

## 2017-09-28 DIAGNOSIS — M2010 Hallux valgus (acquired), unspecified foot: Secondary | ICD-10-CM

## 2017-09-28 DIAGNOSIS — M2041 Other hammer toe(s) (acquired), right foot: Secondary | ICD-10-CM

## 2017-09-28 NOTE — Progress Notes (Signed)
Subjective:    Patient ID: Kim Higgins, female   DOB: 71 y.o.   MRN: [id number]   HPI patient states that overall she's doing pretty well but she was more active and is started to throb and it's bleeding and she is worried there could be a problem    ROS      Objective:  Physical Exam neurovascular status intact negative Homans sign was noted with dressing intact right foot was fresh blood but overall everything looks to be in good alignment with good digital perfusion     Assessment:    Probably patient got active and developed some fresh bleeding but no signs of other pathology     Plan:    Removed a good part of the sterile dressing which really helped discomfort she had but The underlying dressing on and was sterile gloves I reapplied sterile dressing to the foot and applied compression. Patient will continue elevation immobilization and be seen back for regular appointment next week

## 2017-10-04 ENCOUNTER — Ambulatory Visit (INDEPENDENT_AMBULATORY_CARE_PROVIDER_SITE_OTHER): Payer: Medicare Other | Admitting: Podiatry

## 2017-10-04 ENCOUNTER — Ambulatory Visit (INDEPENDENT_AMBULATORY_CARE_PROVIDER_SITE_OTHER): Payer: Medicare Other

## 2017-10-04 VITALS — BP 125/73 | HR 71 | Temp 96.2°F

## 2017-10-04 DIAGNOSIS — M2041 Other hammer toe(s) (acquired), right foot: Secondary | ICD-10-CM

## 2017-10-04 DIAGNOSIS — M2010 Hallux valgus (acquired), unspecified foot: Secondary | ICD-10-CM | POA: Diagnosis not present

## 2017-10-04 MED ORDER — OXYCODONE-ACETAMINOPHEN 10-325 MG PO TABS: ORAL_TABLET | ORAL | 0 refills | Status: DC

## 2017-10-06 NOTE — Progress Notes (Signed)
Subjective:    Patient ID: Kim Higgins, female   DOB: 71 y.o.   MRN: [id number]   HPI patient states doing well with discomfort if I'm on it for too long    ROS      Objective:  Physical Exam neurovascular status intact negative Homans sign was noted with right foot healing well with wound edges well coapted and everything in good alignment. Patient stitches are intact and the correction overall looks good     Assessment:    Doing well post forefoot reconstruction right     Plan:     H&P condition reviewed and recommended continued conservative care with dressing reapplied continued elevation compression and reduced activity. Patient will be seen back in 2 weeks or earlier if any issues should occur  X-rays indicate the osteotomy is healing well fixation is in place with good alignment

## 2017-10-09 NOTE — Progress Notes (Signed)
DOS 09/26/2017 [place] Bunionectomy RT; Neurectomy 3rd RT; Hammertoe Repair 2nd RT; Exostectomy 1st RT

## 2017-10-18 ENCOUNTER — Ambulatory Visit (INDEPENDENT_AMBULATORY_CARE_PROVIDER_SITE_OTHER): Payer: Medicare Other

## 2017-10-18 ENCOUNTER — Ambulatory Visit (INDEPENDENT_AMBULATORY_CARE_PROVIDER_SITE_OTHER): Payer: Medicare Other | Admitting: Podiatry

## 2017-10-18 ENCOUNTER — Encounter: Payer: Self-pay | Admitting: Podiatry

## 2017-10-18 VITALS — BP 155/79 | HR 79 | Resp 16

## 2017-10-18 DIAGNOSIS — M2041 Other hammer toe(s) (acquired), right foot: Secondary | ICD-10-CM

## 2017-10-19 NOTE — Progress Notes (Signed)
Subjective:   Patient ID: Kim Higgins, female   DOB: 71 y.o.   MRN: [id number]   HPI Patient presents stating that she is doing very well with minimal discomfort and able to walk without pain.   ROS      Objective:  Physical Exam  Neurovascular status found to be intact negative Homans sign was noted with well coapted incision site right first metatarsal second digit third interspace with wound edges well coapted stitches in place     Assessment:  Reviewed condition and at this point stitches removed with wound edges coapted well with good alignment noted range of motion and no indication of keratotic lesion.     Plan:  H&P x-ray reviewed with patient.  At this point I recommended continued compression elevation and mobilization and range of motion exercises with gradual increase in activity.  Reappoint in 4 weeks or earlier if needed.  X-rays indicate osteotomy is healing well with slight motion around the first MPJ and I did tell her to be very careful to continue to keep it immobilized but I do think it will heal uneventfully at this position with a slight crack line that should not be symptomatic.  We will need to watch this in the future

## 2017-10-30 ENCOUNTER — Telehealth: Payer: Self-pay | Admitting: *Deleted

## 2017-10-30 NOTE — Telephone Encounter (Signed)
Pt states she would like to discuss the pathology bill from her DOS 09/26/2017.

## 2017-10-31 NOTE — Telephone Encounter (Signed)
Pt called states she was told by Jocelyn Lamer, that the nurse would have to discuss the pathology report, that Dr. Paulla Dolly did not discuss with her. 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 Pt states understanding.

## 2017-11-15 ENCOUNTER — Ambulatory Visit (INDEPENDENT_AMBULATORY_CARE_PROVIDER_SITE_OTHER): Payer: Medicare Other

## 2017-11-15 ENCOUNTER — Encounter: Payer: Self-pay | Admitting: Podiatry

## 2017-11-15 ENCOUNTER — Ambulatory Visit (INDEPENDENT_AMBULATORY_CARE_PROVIDER_SITE_OTHER): Payer: Medicare Other | Admitting: Podiatry

## 2017-11-15 DIAGNOSIS — M2041 Other hammer toe(s) (acquired), right foot: Secondary | ICD-10-CM | POA: Diagnosis not present

## 2017-11-15 DIAGNOSIS — M2010 Hallux valgus (acquired), unspecified foot: Secondary | ICD-10-CM | POA: Diagnosis not present

## 2017-11-15 DIAGNOSIS — D169 Benign neoplasm of bone and articular cartilage, unspecified: Secondary | ICD-10-CM | POA: Diagnosis not present

## 2017-11-15 NOTE — Progress Notes (Signed)
Subjective:   Patient ID: Kim Higgins, female   DOB: 72 y.o.   MRN: [id number]   HPI Patient presents stating she is doing well with still some swelling if she is on her foot all day and she admits she has not been bending her big toe as much as she should   ROS      Objective:  Physical Exam  Neurovascular status intact negative Homans sign noted with well-healing surgical site right first metatarsal with hallux in rectus position and good alignment with no irritation to the joint or crepitus     Assessment:  Patient is progressing well from surgery right     Plan:  H&P x-ray reviewed and discussed the importance of bending the big toe joint.  Patient will work on this and will be seen back for [place] to reevaluate again in the next 6 weeks and is encouraged to use begin wearing tennis shoes  X-rays indicate the osteotomy is healing well with very minimal motion of the first MPJ with fixation in place and good reduction of the deformity

## 2017-11-20 ENCOUNTER — Telehealth: Payer: Self-pay | Admitting: Podiatry

## 2017-11-20 NOTE — Telephone Encounter (Signed)
I was calling to see if I could get another compression anklet for my right foot. My number is [phone]. Thank you.

## 2017-11-20 NOTE — Telephone Encounter (Signed)
I told pt we could sell her another or she could try pharmacy first aid area or foot supply area.

## 2017-12-27 ENCOUNTER — Ambulatory Visit (INDEPENDENT_AMBULATORY_CARE_PROVIDER_SITE_OTHER): Payer: Medicare Other

## 2017-12-27 ENCOUNTER — Encounter: Payer: Self-pay | Admitting: Podiatry

## 2017-12-27 ENCOUNTER — Ambulatory Visit (INDEPENDENT_AMBULATORY_CARE_PROVIDER_SITE_OTHER): Payer: Medicare Other | Admitting: Podiatry

## 2017-12-27 DIAGNOSIS — M2041 Other hammer toe(s) (acquired), right foot: Secondary | ICD-10-CM

## 2017-12-27 DIAGNOSIS — M2010 Hallux valgus (acquired), unspecified foot: Secondary | ICD-10-CM

## 2017-12-27 DIAGNOSIS — M779 Enthesopathy, unspecified: Secondary | ICD-10-CM | POA: Diagnosis not present

## 2017-12-27 NOTE — Progress Notes (Signed)
Subjective:   Patient ID: Kim Higgins, female   DOB: 72 y.o.   MRN: [id number]   HPI Patient presents overall doing pretty well but I am getting pain in my midfoot right and it feels like I am walking on the bones   ROS      Objective:  Physical Exam  Neurovascular status intact with patient found to have inflammation mostly centered around the second metatarsal phalangeal joint right with good structural bunion correction and good alignment of the hallux second digit with no lesion noted between the 2 toes     Assessment:  Doing well after having had forefoot reconstruction right with inflammatory changes second MPJ right foot     Plan:  H&P condition reviewed and due to bone exposure bilateral and discomfort with ambulation I recommended a soft type orthotic with offloading of the forefoot bilateral with probable dispersion pad around the second metatarsal phalangeal joint right.  I am referring the ped orthotist to have these made and again I do think these will be made of a softer type material in order to offload the metatarsals properly  X-rays indicate that there is good healing of the osteotomy site with good correction of the digits and no indication of bone pathology currently

## 2018-01-02 ENCOUNTER — Ambulatory Visit (INDEPENDENT_AMBULATORY_CARE_PROVIDER_SITE_OTHER): Payer: Medicare Other | Admitting: Orthotics

## 2018-01-02 DIAGNOSIS — M2041 Other hammer toe(s) (acquired), right foot: Secondary | ICD-10-CM

## 2018-01-02 DIAGNOSIS — M2010 Hallux valgus (acquired), unspecified foot: Secondary | ICD-10-CM

## 2018-01-02 DIAGNOSIS — M79676 Pain in unspecified toe(s): Secondary | ICD-10-CM

## 2018-01-02 DIAGNOSIS — M779 Enthesopathy, unspecified: Secondary | ICD-10-CM

## 2018-01-02 NOTE — Progress Notes (Signed)
Patient presents today with metatarsalgia and asso pain bilat; she also is symptomatic of cap 2nd right; Dr. Paulla Dolly has ordered a soft orthotic w/ forefoot dispersion at 2 R MPJ;  Plan on dancers pad type of cushioning with neuroma pad just proximal of 2nd R MPJ.   $300 signed ABN

## 2018-01-11 DIAGNOSIS — M4726 Other spondylosis with radiculopathy, lumbar region: Secondary | ICD-10-CM | POA: Diagnosis not present

## 2018-01-11 DIAGNOSIS — J449 Chronic obstructive pulmonary disease, unspecified: Secondary | ICD-10-CM | POA: Diagnosis not present

## 2018-01-11 DIAGNOSIS — I10 Essential (primary) hypertension: Secondary | ICD-10-CM | POA: Diagnosis not present

## 2018-01-30 ENCOUNTER — Other Ambulatory Visit: Payer: Medicare Other | Admitting: Orthotics

## 2018-03-19 DIAGNOSIS — I10 Essential (primary) hypertension: Secondary | ICD-10-CM | POA: Diagnosis not present

## 2018-03-23 ENCOUNTER — Other Ambulatory Visit: Payer: Self-pay | Admitting: Internal Medicine

## 2018-03-23 DIAGNOSIS — J449 Chronic obstructive pulmonary disease, unspecified: Secondary | ICD-10-CM | POA: Diagnosis not present

## 2018-03-23 DIAGNOSIS — R42 Dizziness and giddiness: Secondary | ICD-10-CM | POA: Diagnosis not present

## 2018-03-23 DIAGNOSIS — I10 Essential (primary) hypertension: Secondary | ICD-10-CM | POA: Diagnosis not present

## 2018-03-23 DIAGNOSIS — N289 Disorder of kidney and ureter, unspecified: Secondary | ICD-10-CM | POA: Diagnosis not present

## 2018-03-23 DIAGNOSIS — F418 Other specified anxiety disorders: Secondary | ICD-10-CM | POA: Diagnosis not present

## 2018-03-23 DIAGNOSIS — Z0000 Encounter for general adult medical examination without abnormal findings: Secondary | ICD-10-CM | POA: Diagnosis not present

## 2018-03-23 DIAGNOSIS — Z1389 Encounter for screening for other disorder: Secondary | ICD-10-CM | POA: Diagnosis not present

## 2018-03-23 DIAGNOSIS — K219 Gastro-esophageal reflux disease without esophagitis: Secondary | ICD-10-CM | POA: Diagnosis not present

## 2018-04-03 ENCOUNTER — Ambulatory Visit
Admission: RE | Admit: 2018-04-03 | Discharge: 2018-04-03 | Disposition: A | Discharge status: Home / Self Care | Payer: Medicare Other | Source: Ambulatory Visit | Attending: Internal Medicine | Admitting: Internal Medicine

## 2018-04-03 DIAGNOSIS — R42 Dizziness and giddiness: Secondary | ICD-10-CM

## 2018-04-03 DIAGNOSIS — I6523 Occlusion and stenosis of bilateral carotid arteries: Secondary | ICD-10-CM | POA: Diagnosis not present

## 2018-04-06 ENCOUNTER — Other Ambulatory Visit (HOSPITAL_COMMUNITY): Payer: Self-pay | Admitting: Internal Medicine

## 2018-04-06 DIAGNOSIS — R42 Dizziness and giddiness: Secondary | ICD-10-CM

## 2018-04-12 DIAGNOSIS — J449 Chronic obstructive pulmonary disease, unspecified: Secondary | ICD-10-CM | POA: Diagnosis not present

## 2018-04-13 ENCOUNTER — Ambulatory Visit (INDEPENDENT_AMBULATORY_CARE_PROVIDER_SITE_OTHER): Payer: Medicare Other

## 2018-04-13 DIAGNOSIS — R42 Dizziness and giddiness: Secondary | ICD-10-CM

## 2018-04-19 ENCOUNTER — Ambulatory Visit: Payer: Medicare Other | Admitting: Cardiology

## 2018-04-20 ENCOUNTER — Encounter: Payer: Self-pay | Admitting: Cardiovascular Disease

## 2018-04-20 ENCOUNTER — Ambulatory Visit: Payer: Medicare Other | Admitting: Cardiology

## 2018-04-20 ENCOUNTER — Ambulatory Visit (INDEPENDENT_AMBULATORY_CARE_PROVIDER_SITE_OTHER): Payer: Medicare Other | Admitting: Cardiovascular Disease

## 2018-04-20 VITALS — BP 146/74 | HR 76 | Ht 64.0 in | Wt 126.0 lb

## 2018-04-20 DIAGNOSIS — R42 Dizziness and giddiness: Secondary | ICD-10-CM | POA: Diagnosis not present

## 2018-04-20 DIAGNOSIS — I771 Stricture of artery: Secondary | ICD-10-CM | POA: Diagnosis not present

## 2018-04-20 DIAGNOSIS — I739 Peripheral vascular disease, unspecified: Secondary | ICD-10-CM

## 2018-04-20 NOTE — Progress Notes (Addendum)
04/20/2018 [name]   1946-04-09  [id number]  Primary Physician Leeroy Cha, MD Primary Cardiologist: Lorretta Harp MD [facility], [place], [place]  HPI:  Kim Higgins is a 72 y.o. female who I last saw in the office 07/25/2017 .She was remotely a patient of Dr. Golden Hurter. 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 She has a history of hypertension, discontinued tobacco abuse and mild left subclavian artery stenosis. She denies chest pain but does get shortness of breath from COPD improved on inhaled bronchodilators. Since I saw her year ago she denies chest pain or shortness of breath.   Since I saw her back in September of last year she has complained of new onset episodic dizziness without syncope.  Recent Holter monitor did not show any arrhythmias that would be implicated in this except for occasional PVCs and a short atrial run.  She did not have any episodes while wearing the Holter monitor.  She does have left subclavian artery stenosis which could be causing the symptoms as well.    Current Meds  Medication Sig  . albuterol-ipratropium (COMBIVENT) 18-103 MCG/ACT inhaler Inhale 1 puff into the lungs as needed for wheezing or shortness of breath.  . ASCORBIC ACID PO Take 350 mg by mouth daily.  Marland Kitchen b complex vitamins tablet Take 1 tablet by mouth daily.  Marland Kitchen buPROPion (WELLBUTRIN SR) 150 MG 12 hr tablet Take 300 mg by mouth daily.   . Cholecalciferol (VITAMIN D-3) 1000 UNITS CAPS Take 1 capsule by mouth daily.  Marland Kitchen estradiol (ESTRACE) 2 MG tablet Take 2 mg by mouth daily.  . Fluticasone-Salmeterol (ADVAIR) 250-50 MCG/DOSE AEPB Inhale 1 puff into the lungs daily.   . irbesartan (AVAPRO) 150 MG tablet Take 1 tablet (150 mg total) by mouth daily.  . Multiple Vitamins-Minerals (MULTIVITAMIN ADULT PO) Take 1 tablet by mouth daily. Women's Over 21  . Omega-3 Fatty Acids (FISH OIL) 1200 MG CAPS Take 1 capsule by mouth daily.  . ranitidine (ZANTAC) 150 MG tablet  Take 150 mg by mouth at bedtime.  . vitamin E 400 UNIT capsule Take 400 Units by mouth daily.     Allergies  Allergen Reactions  . Nystatin Diarrhea    Social History   Socioeconomic History  . Marital status: Single    Spouse name: Not on file  . Number of children: Not on file  . Years of education: Not on file  . Highest education level: Not on file  Occupational History  . Not on file  Social Needs  . Financial resource strain: Not on file  . Food insecurity:    Worry: Not on file    Inability: Not on file  . Transportation needs:    Medical: Not on file    Non-medical: Not on file  Tobacco Use  . Smoking status: Former Smoker    Packs/day: 0.00    Years: 30.00    Pack years: 0.00    Types: Cigarettes    Last attempt to quit: 08/14/2014    Years since quitting: 3.6  . Smokeless tobacco: Never Used  Substance and Sexual Activity  . Alcohol use: Yes    Alcohol/week: 2.0 oz    Types: 4 Standard drinks or equivalent per week  . Drug use: Not on file  . Sexual activity: Not on file  Lifestyle  . Physical activity:    Days per week: Not on file    Minutes per session: Not on  file  . Stress: Not on file  Relationships  . Social connections:    Talks on phone: Not on file    Gets together: Not on file    Attends religious service: Not on file    Active member of club or organization: Not on file    Attends meetings of clubs or organizations: Not on file    Relationship status: Not on file  . Intimate partner violence:    Fear of current or ex partner: Not on file    Emotionally abused: Not on file    Physically abused: Not on file    Forced sexual activity: Not on file  Other Topics Concern  . Not on file  Social History Narrative  . Not on file     Review of Systems: General: negative for chills, fever, night sweats or weight changes.  Cardiovascular: negative for chest pain, dyspnea on exertion, edema, orthopnea, palpitations, paroxysmal nocturnal  dyspnea or shortness of breath Dermatological: negative for rash Respiratory: negative for cough or wheezing Urologic: negative for hematuria Abdominal: negative for nausea, vomiting, diarrhea, bright red blood per rectum, melena, or hematemesis Neurologic: negative for visual changes, syncope, or dizziness All other systems reviewed and are otherwise negative except as noted above.    Blood pressure 114/70, pulse 76, height 5\' 4"  (1.626 m), weight 126 lb (57.2 kg).  General appearance: alert and no distress Neck: no adenopathy, no JVD, supple, symmetrical, trachea midline, thyroid not enlarged, symmetric, no tenderness/mass/nodules and Mild left subclavian bruit Lungs: clear to auscultation bilaterally Heart: regular rate and rhythm, S1, S2 normal, no murmur, click, rub or gallop Extremities: extremities normal, atraumatic, no cyanosis or edema Pulses: 2+ and symmetric Skin: Skin color, texture, turgor normal. No rashes or lesions Neurologic: Alert and oriented X 3, normal strength and tone. Normal symmetric reflexes. Normal coordination and gait  EKG normal sinus rhythm at 76 with septal Q waves .  I personally reviewed this EKG.  ASSESSMENT AND PLAN:   Smoker Discontinued 3 years ago.  HTN (hypertension) History of essential hypertension with blood pressure measured today at 14/70 in the left arm.  She is on Avapro.  Current meds at current dosing.  PVD (peripheral vascular disease)- 50% LSCA stenosis by doppler 2012, History of left subclavian artery stenosis.  We will get upper extremity arterial Doppler studies.  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  She does have a blood pressure differential in her upper extremities with blood pressure in her left arm of 122/76 and in the right arm of 146/74.  Dizziness Ms. Devinney complains of episodic dizziness over the last 8 months or shortness of it occurs for no  particular reason lasting 30 seconds at a time.  There is no particular warning.  She is actually not lost consciousness although they have occurred while she is driving.  A recent Holter monitor showed only occasional PVCs and a short atrial run probably noncontributory although she did not have an episode while wearing the monitor.  I am going to get a 30-day event monitor to rule out an arrhythmogenic cause as well as upper extremity arterial Doppler studies, and we will see her back after that to review the results and make a determination regarding etiology.      Lorretta Harp MD FACP,FACC,FAHA, [facility] 04/20/2018 8:33 AM

## 2018-04-20 NOTE — Assessment & Plan Note (Signed)
Kim Higgins complains of episodic dizziness over the last 8 months or shortness of it occurs for no particular reason lasting 30 seconds at a time.  There is no particular warning.  She is actually not lost consciousness although they have occurred while she is driving.  A recent Holter monitor showed only occasional PVCs and a short atrial run probably noncontributory although she did not have an episode while wearing the monitor.  I am going to get a 30-day event monitor to rule out an arrhythmogenic cause as well as upper extremity arterial Doppler studies, and we will see her back after that to review the results and make a determination regarding etiology.

## 2018-04-20 NOTE — Patient Instructions (Signed)
Medication Instructions: Your physician recommends that you continue on your current medications as directed. Please refer to the Current Medication list given to you today.   Testing/Procedures: Your physician has requested that you have an upper extremity arterial duplex. This test is an ultrasound of the arteries in the legs or arms. It looks at arterial blood flow in the legs and arms. Allow one hour for Lower and Upper Arterial scans. There are no restrictions or special instructions.  Your physician has recommended that you wear a 30 day event monitor. Event monitors are medical devices that record the heart's electrical activity. Doctors most often [place] these monitors to diagnose arrhythmias. Arrhythmias are problems with the speed or rhythm of the heartbeat. The monitor is a small, portable device. You can wear one while you do your normal daily activities. This is usually used to diagnose what is causing palpitations/syncope (passing out).  Follow-Up: Your physician recommends that you schedule a follow-up appointment in: 4-6 weeks with Dr. Gwenlyn Found.  If you need a refill on your cardiac medications before your next appointment, please call your pharmacy.

## 2018-04-20 NOTE — Assessment & Plan Note (Signed)
Discontinued 3 years ago.

## 2018-04-20 NOTE — Assessment & Plan Note (Signed)
History of essential hypertension with blood pressure measured today at 14/70 in the left arm.  She is on Avapro.  Current meds at current dosing.

## 2018-04-20 NOTE — Assessment & Plan Note (Addendum)
History of left subclavian artery stenosis.  We will get upper extremity arterial Doppler studies.  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  She does have a blood pressure differential in her upper extremities with blood pressure in her left arm of 122/76 and in the right arm of 146/74.

## 2018-04-23 ENCOUNTER — Other Ambulatory Visit: Payer: Self-pay | Admitting: Cardiovascular Disease

## 2018-04-23 DIAGNOSIS — I771 Stricture of artery: Secondary | ICD-10-CM

## 2018-04-24 ENCOUNTER — Ambulatory Visit (HOSPITAL_COMMUNITY)
Admission: RE | Admit: 2018-04-24 | Discharge: 2018-04-24 | Disposition: A | Discharge status: Home / Self Care | Payer: Medicare Other | Source: Ambulatory Visit | Attending: Cardiovascular Disease | Admitting: Cardiovascular Disease

## 2018-04-24 DIAGNOSIS — I771 Stricture of artery: Secondary | ICD-10-CM | POA: Diagnosis not present

## 2018-04-30 ENCOUNTER — Ambulatory Visit (INDEPENDENT_AMBULATORY_CARE_PROVIDER_SITE_OTHER): Payer: Medicare Other

## 2018-04-30 DIAGNOSIS — R42 Dizziness and giddiness: Secondary | ICD-10-CM

## 2018-06-20 ENCOUNTER — Ambulatory Visit (INDEPENDENT_AMBULATORY_CARE_PROVIDER_SITE_OTHER): Payer: Medicare Other | Admitting: Cardiovascular Disease

## 2018-06-20 ENCOUNTER — Encounter: Payer: Self-pay | Admitting: Cardiovascular Disease

## 2018-06-20 DIAGNOSIS — R42 Dizziness and giddiness: Secondary | ICD-10-CM

## 2018-06-20 MED ORDER — IRBESARTAN 150 MG PO TABS: 150.0000 mg | ORAL_TABLET | Freq: Every day | ORAL | 3 refills | Status: DC

## 2018-06-20 NOTE — Assessment & Plan Note (Signed)
Ms. Highley returns today for follow-up of her outpatient test performed and evaluate for evaluation of dizziness.  Her event monitor showed sinus rhythm, sinus bradycardia, occasional PACs and PVCs and a short run of nonsustained ventricular tachycardia.  Interestingly, she was not dizzy at all while wearing the monitor.  Her upper extremity Doppler showed no evidence of subclavian artery stenosis.  At this point, we will follow her conservatively and will see her back in 6 months for follow-up.

## 2018-06-20 NOTE — Progress Notes (Signed)
Kim Higgins returns today for follow-up of her outpatient test performed and evaluate for evaluation of dizziness.  Her event monitor showed sinus rhythm, sinus bradycardia, occasional PACs and PVCs and a short run of nonsustained ventricular tachycardia.  Interestingly, she was not dizzy at all while wearing the monitor.  Her upper extremity Doppler showed no evidence of subclavian artery stenosis.  At this point, we will follow her conservatively and will see her back in 6 months for follow-up.   Lorretta Harp, M.D., [place], [facility], [place] [address] [place], [place]  [address]  [phone] 06/20/2018 11:47 AM

## 2018-06-20 NOTE — Patient Instructions (Signed)
Your physician wants you to follow-up in: 6 MONTHS WITH APP You will receive a reminder letter in the mail two months in advance. If you don't receive a letter, please call our office to schedule the follow-up appointment.   Your physician wants you to follow-up in: ONE YEAR WITH DR BERRY You will receive a reminder letter in the mail two months in advance. If you don't receive a letter, please call our office to schedule the follow-up appointment.   If you need a refill on your cardiac medications before your next appointment, please call your pharmacy.  

## 2018-06-26 ENCOUNTER — Ambulatory Visit: Payer: Medicare Other | Admitting: Cardiovascular Disease

## 2018-07-04 DIAGNOSIS — H5212 Myopia, left eye: Secondary | ICD-10-CM | POA: Diagnosis not present

## 2018-07-04 DIAGNOSIS — H5201 Hypermetropia, right eye: Secondary | ICD-10-CM | POA: Diagnosis not present

## 2018-07-04 DIAGNOSIS — Z961 Presence of intraocular lens: Secondary | ICD-10-CM | POA: Diagnosis not present

## 2018-08-09 DIAGNOSIS — M25512 Pain in left shoulder: Secondary | ICD-10-CM | POA: Diagnosis not present

## 2018-08-09 DIAGNOSIS — M79605 Pain in left leg: Secondary | ICD-10-CM | POA: Diagnosis not present

## 2018-08-09 DIAGNOSIS — M25552 Pain in left hip: Secondary | ICD-10-CM | POA: Diagnosis not present

## 2018-08-09 DIAGNOSIS — M79642 Pain in left hand: Secondary | ICD-10-CM | POA: Diagnosis not present

## 2018-08-09 DIAGNOSIS — S61009A Unspecified open wound of unspecified thumb without damage to nail, initial encounter: Secondary | ICD-10-CM | POA: Diagnosis not present

## 2018-08-09 DIAGNOSIS — M79641 Pain in right hand: Secondary | ICD-10-CM | POA: Diagnosis not present

## 2018-08-09 DIAGNOSIS — S60512A Abrasion of left hand, initial encounter: Secondary | ICD-10-CM | POA: Diagnosis not present

## 2018-08-09 DIAGNOSIS — R6884 Jaw pain: Secondary | ICD-10-CM | POA: Diagnosis not present

## 2018-08-10 DIAGNOSIS — S61412S Laceration without foreign body of left hand, sequela: Secondary | ICD-10-CM | POA: Diagnosis not present

## 2018-08-10 DIAGNOSIS — J449 Chronic obstructive pulmonary disease, unspecified: Secondary | ICD-10-CM | POA: Diagnosis not present

## 2018-08-10 DIAGNOSIS — I10 Essential (primary) hypertension: Secondary | ICD-10-CM | POA: Diagnosis not present

## 2018-08-10 DIAGNOSIS — F17290 Nicotine dependence, other tobacco product, uncomplicated: Secondary | ICD-10-CM | POA: Diagnosis not present

## 2018-08-10 DIAGNOSIS — W19XXXA Unspecified fall, initial encounter: Secondary | ICD-10-CM | POA: Diagnosis not present

## 2018-08-16 DIAGNOSIS — K219 Gastro-esophageal reflux disease without esophagitis: Secondary | ICD-10-CM | POA: Diagnosis not present

## 2018-08-16 DIAGNOSIS — S61412S Laceration without foreign body of left hand, sequela: Secondary | ICD-10-CM | POA: Diagnosis not present

## 2018-09-26 DIAGNOSIS — R51 Headache: Secondary | ICD-10-CM | POA: Diagnosis not present

## 2018-09-26 DIAGNOSIS — Z6821 Body mass index (BMI) 21.0-21.9, adult: Secondary | ICD-10-CM | POA: Diagnosis not present

## 2018-09-26 DIAGNOSIS — Z1231 Encounter for screening mammogram for malignant neoplasm of breast: Secondary | ICD-10-CM | POA: Diagnosis not present

## 2018-09-26 DIAGNOSIS — Z01419 Encounter for gynecological examination (general) (routine) without abnormal findings: Secondary | ICD-10-CM | POA: Diagnosis not present

## 2018-09-30 IMAGING — MG 2D DIGITAL DIAGNOSTIC UNILATERAL RIGHT MAMMOGRAM WITH CAD AND AD
6 of 9 series · 6 of 21 positions shown · non-contrast
Comparison: Previous exam(s).

CLINICAL DATA: Patient recalled from screening for right breast
mass.

EXAM:
2D DIGITAL DIAGNOSTIC RIGHT MAMMOGRAM WITH IMPLANTS, CAD AND ADJUNCT
TOMO
ULTRASOUND RIGHT BREAST
The patient has retropectoral implants. Standard and implant
displaced views were performed.

[R CC synth-2D]
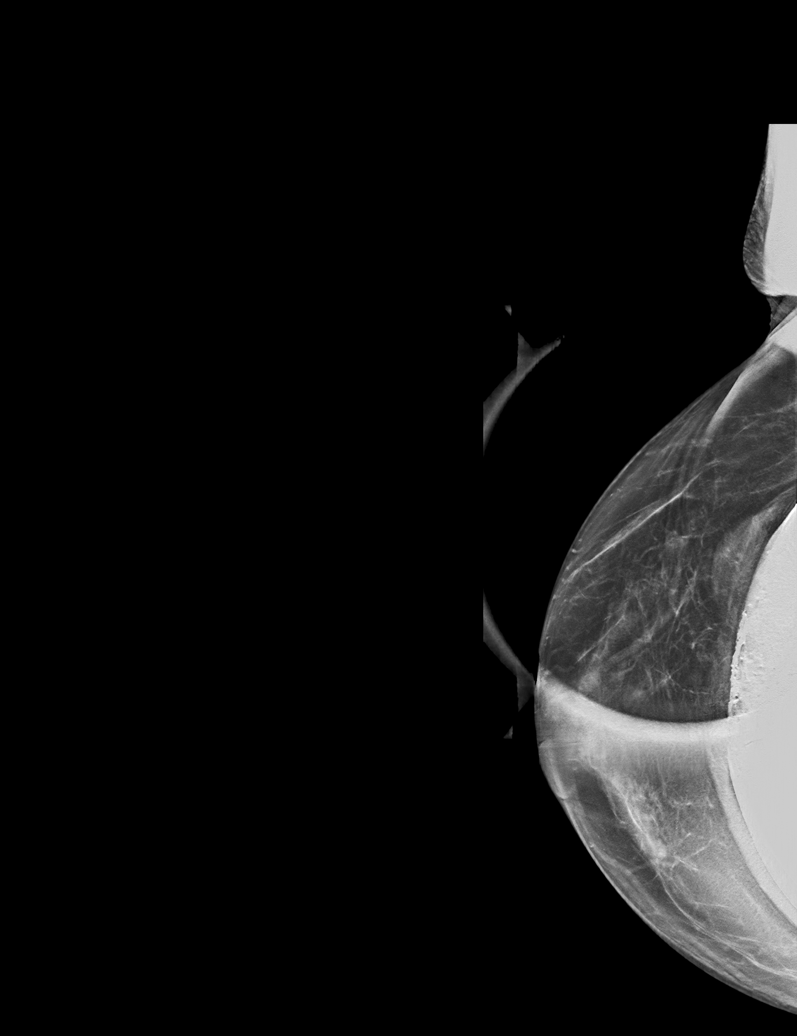

[R MLO (1 of 2)]
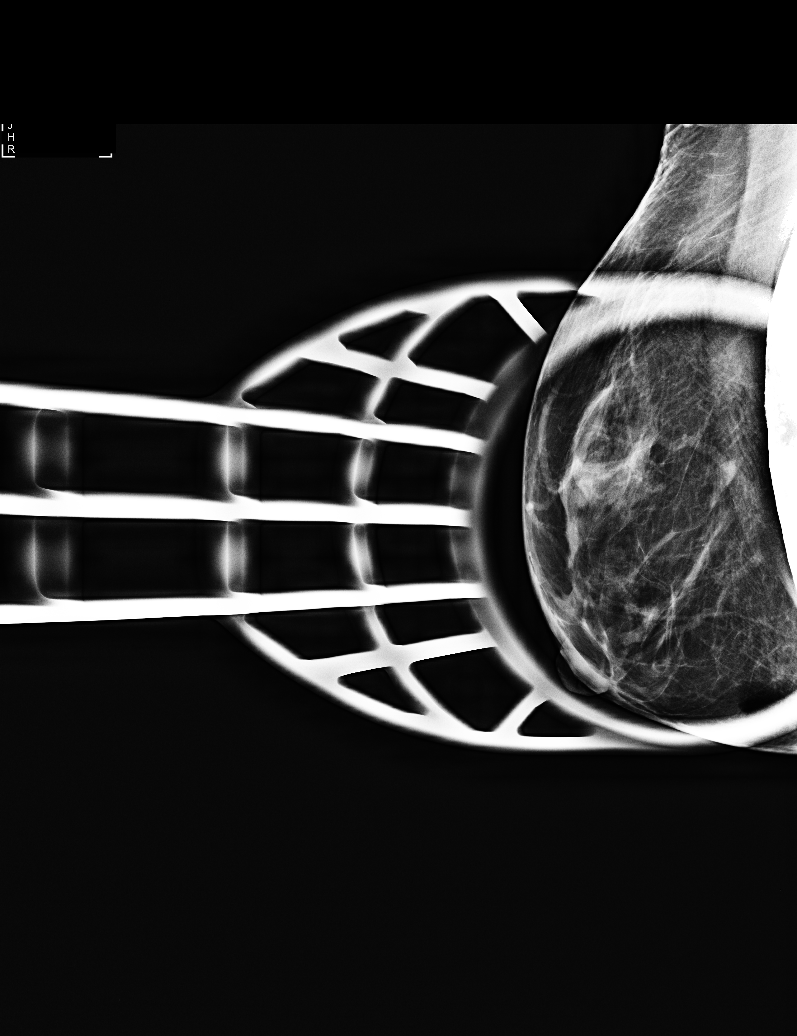

[R CC]
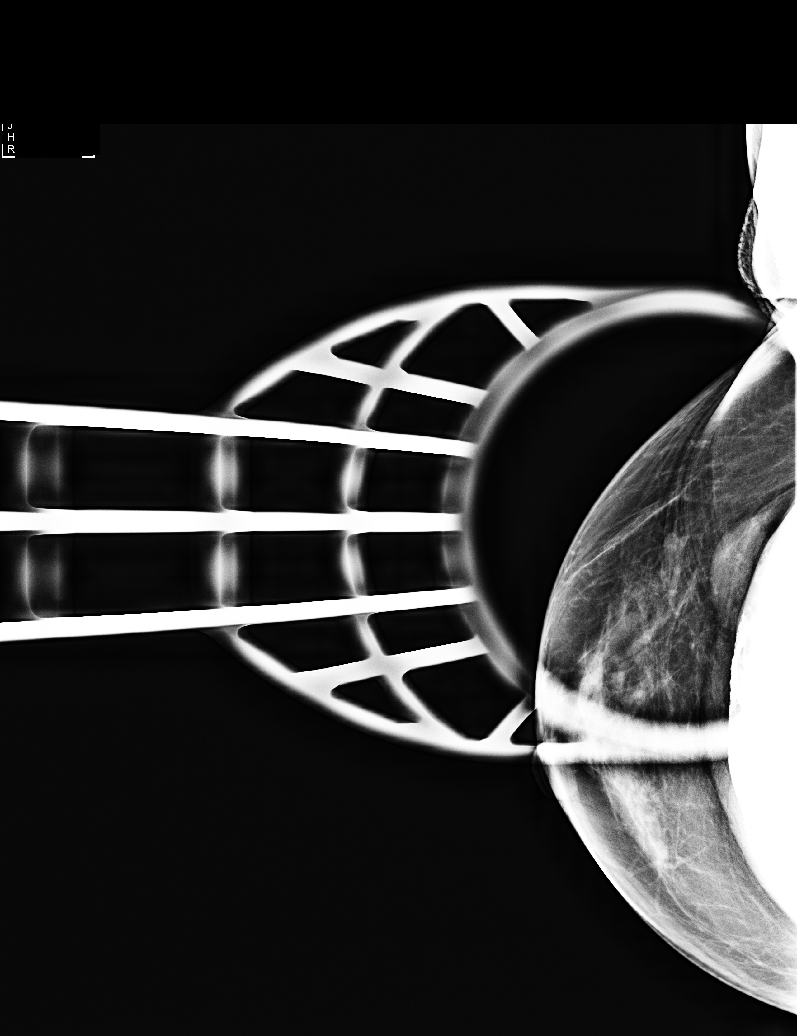

[R MLO synth-2D (1 of 2)]
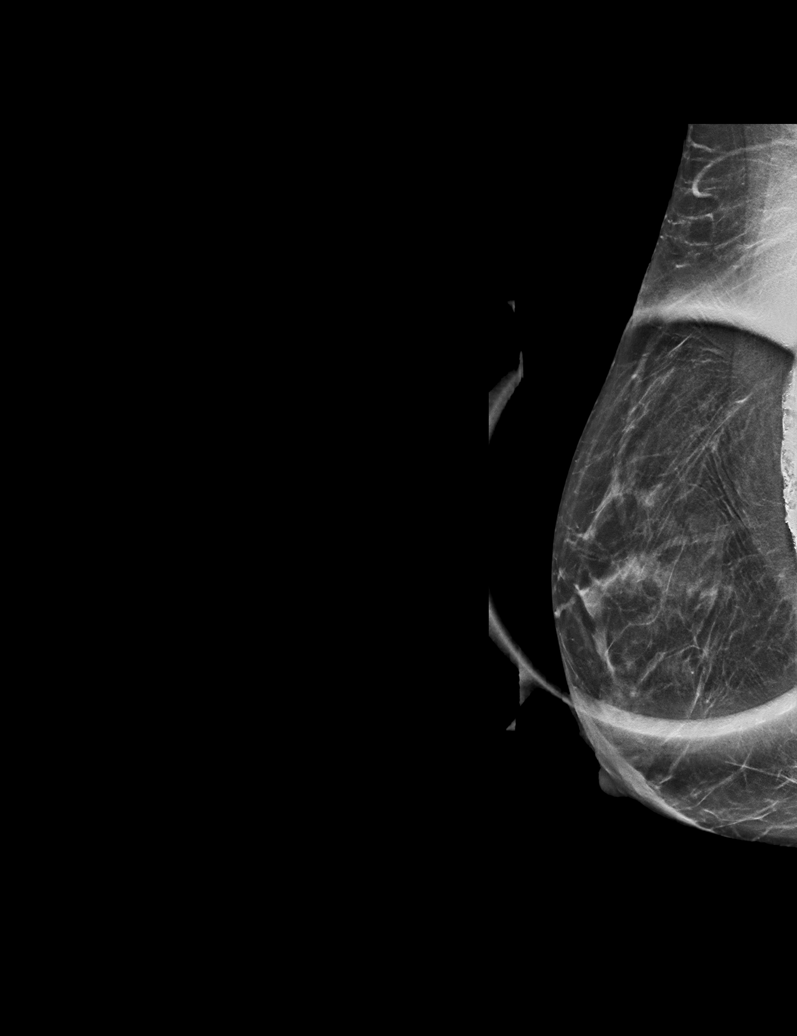

[R MLO synth-2D (2 of 2)]
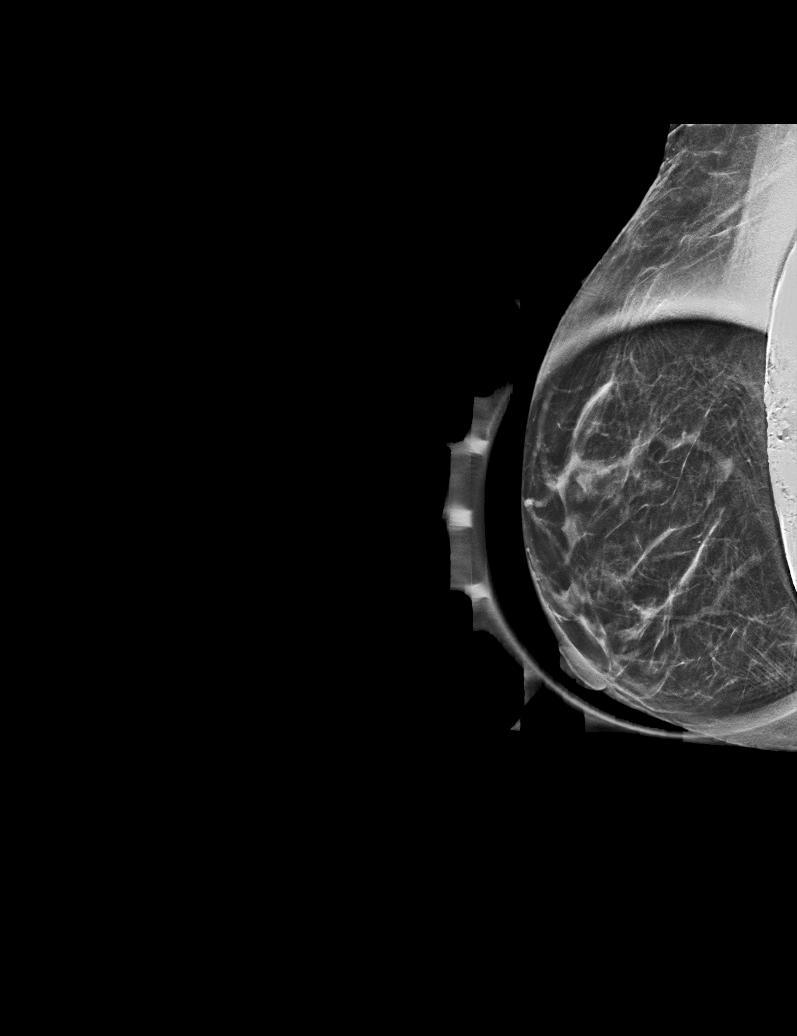

[R MLO (2 of 2)]
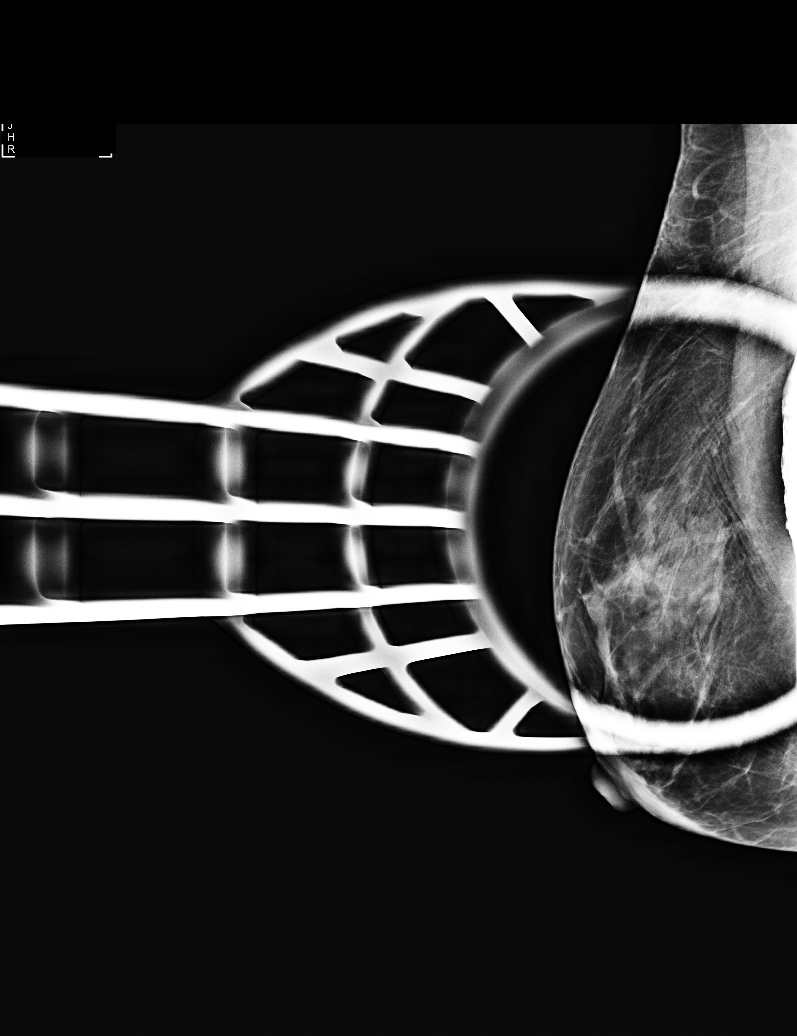

[6 of 21 positions shown; findings below may reference images not displayed]

ACR Breast Density Category c: The breast tissue is heterogeneously
dense, which may obscure small masses.
FINDINGS: Spot compression CC and MLO tomosynthesis images of the right breast
demonstrate a persistent oval circumscribed low-density mass within
the posterolateral right breast.

On physical exam, I palpate no discrete mass within the
posterolateral right breast.

Targeted ultrasound is performed, showing a 4 x 3 x 9 mm cyst right
breast 9 o'clock position 3 cm from the nipple. Additionally there
the nipple.

Mammographic images were processed with CAD.
IMPRESSION: Right breast simple cyst.

No mammographic evidence for malignancy.

RECOMMENDATION:
Screening mammogram in one year.(Code:[id number])

I have discussed the findings and recommendations with the patient.
Results were also provided in writing at the conclusion of the
visit. If applicable, a reminder letter will be sent to the patient
regarding the next appointment.

BI-RADS CATEGORY  2: Benign.

## 2019-03-26 DIAGNOSIS — R05 Cough: Secondary | ICD-10-CM | POA: Diagnosis not present

## 2019-03-26 DIAGNOSIS — J449 Chronic obstructive pulmonary disease, unspecified: Secondary | ICD-10-CM | POA: Diagnosis not present

## 2019-03-26 DIAGNOSIS — K219 Gastro-esophageal reflux disease without esophagitis: Secondary | ICD-10-CM | POA: Diagnosis not present

## 2019-03-26 DIAGNOSIS — I10 Essential (primary) hypertension: Secondary | ICD-10-CM | POA: Diagnosis not present

## 2019-03-26 DIAGNOSIS — F17290 Nicotine dependence, other tobacco product, uncomplicated: Secondary | ICD-10-CM | POA: Diagnosis not present

## 2019-03-27 DIAGNOSIS — R05 Cough: Secondary | ICD-10-CM | POA: Diagnosis not present

## 2019-04-01 DIAGNOSIS — Z0000 Encounter for general adult medical examination without abnormal findings: Secondary | ICD-10-CM | POA: Diagnosis not present

## 2019-04-01 DIAGNOSIS — I10 Essential (primary) hypertension: Secondary | ICD-10-CM | POA: Diagnosis not present

## 2019-04-01 DIAGNOSIS — K219 Gastro-esophageal reflux disease without esophagitis: Secondary | ICD-10-CM | POA: Diagnosis not present

## 2019-04-01 DIAGNOSIS — J449 Chronic obstructive pulmonary disease, unspecified: Secondary | ICD-10-CM | POA: Diagnosis not present

## 2019-04-01 DIAGNOSIS — Z1159 Encounter for screening for other viral diseases: Secondary | ICD-10-CM | POA: Diagnosis not present

## 2019-04-12 DIAGNOSIS — I10 Essential (primary) hypertension: Secondary | ICD-10-CM | POA: Diagnosis not present

## 2019-04-12 DIAGNOSIS — Z1159 Encounter for screening for other viral diseases: Secondary | ICD-10-CM | POA: Diagnosis not present

## 2019-06-27 DIAGNOSIS — L03115 Cellulitis of right lower limb: Secondary | ICD-10-CM | POA: Diagnosis not present

## 2019-07-03 ENCOUNTER — Other Ambulatory Visit: Payer: Self-pay

## 2019-07-03 ENCOUNTER — Ambulatory Visit (INDEPENDENT_AMBULATORY_CARE_PROVIDER_SITE_OTHER): Payer: Medicare Other | Admitting: Cardiovascular Disease

## 2019-07-03 ENCOUNTER — Encounter: Payer: Self-pay | Admitting: Cardiovascular Disease

## 2019-07-03 VITALS — BP 118/70 | HR 75 | Temp 97.0°F | Ht 64.0 in | Wt 113.0 lb

## 2019-07-03 DIAGNOSIS — I10 Essential (primary) hypertension: Secondary | ICD-10-CM

## 2019-07-03 DIAGNOSIS — Z13220 Encounter for screening for lipoid disorders: Secondary | ICD-10-CM

## 2019-07-03 DIAGNOSIS — I739 Peripheral vascular disease, unspecified: Secondary | ICD-10-CM

## 2019-07-03 DIAGNOSIS — R42 Dizziness and giddiness: Secondary | ICD-10-CM

## 2019-07-03 NOTE — Assessment & Plan Note (Signed)
No further episodes of dizziness.  Dopplers revealed nonsignificant disease left subclavian and prior event monitor did not show any arrhythmias that would contribute to dizziness.

## 2019-07-03 NOTE — Assessment & Plan Note (Addendum)
History of essential hypertension with blood pressure in the left arm measuring 118/70 on Avapro.  Her blood pressure pressures were essentially symmetric and equal bilaterally.

## 2019-07-03 NOTE — Assessment & Plan Note (Signed)
History of mild left subclavian artery stenosis by duplex ultrasound performed 04/30/2018.  Ended up repeat carotid Doppler studies.

## 2019-07-03 NOTE — Patient Instructions (Signed)
Medication Instructions:  Your physician recommends that you continue on your current medications as directed. Please refer to the Current Medication list given to you today.  If you need a refill on your cardiac medications before your next appointment, please call your pharmacy.   Lab work: Your physician recommends that you return for lab work within 1 week: Leslie  If you have labs (blood work) drawn today and your tests are completely normal, you will receive your results only by: Marland Kitchen MyChart Message (if you have MyChart) OR . A paper copy in the mail If you have any lab test that is abnormal or we need to change your treatment, we will call you to review the results.  Testing/Procedures: Your physician has requested that you have a carotid duplex. This test is an ultrasound of the carotid arteries in your neck. It looks at blood flow through these arteries that supply the brain with blood. Allow one hour for this exam. There are no restrictions or special instructions.  Follow-Up: At [facility], you and your health needs are our priority.  As part of our continuing mission to provide you with exceptional heart care, we have created designated Provider Care Teams.  These Care Teams include your primary Cardiologist (physician) and Advanced Practice Providers (APPs -  Physician Assistants and Nurse Practitioners) who all work together to provide you with the care you need, when you need it. You will need a follow up appointment in 12 months with Dr. Quay Burow.  Please call our office 2 months in advance to schedule this/each appointment.

## 2019-07-03 NOTE — Progress Notes (Signed)
07/03/2019 ANE CONERLY   01-Mar-1946  [id number]  Primary Physician Leeroy Cha, MD Primary Cardiologist: Lorretta Harp MD FACP, [place], [place], [place]  HPI:  Kim Higgins is a 73 y.o.  who I last saw in the office  04/20/2018.She was remotely a patient of Dr. Golden Hurter. 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 She has a history of hypertension, discontinued tobacco abuse and mild left subclavian artery stenosis. She denies chest pain but does get shortness of breath from COPD improved on inhaled bronchodilators. Since I saw her year ago she denies chest pain or shortness of breath.  When I saw her last year, she was complaining of new onset episodic dizziness without syncope.  Recent Holter monitor did not show any arrhythmias that would be implicated in this except for occasional PVCs and a short atrial run.  She did not have any episodes while wearing the Holter monitor.  She does have left subclavian artery stenosis which could be causing the symptoms as well.  Since I saw her a year ago she is had no recurrent dizziness episodes.  She denies chest pain or shortness of breath.    Current Meds  Medication Sig  . albuterol-ipratropium (COMBIVENT) 18-103 MCG/ACT inhaler Inhale 1 puff into the lungs as needed for wheezing or shortness of breath.  . ASCORBIC ACID PO Take 350 mg by mouth daily.  Marland Kitchen b complex vitamins tablet Take 1 tablet by mouth daily.  Marland Kitchen buPROPion (WELLBUTRIN SR) 150 MG 12 hr tablet Take 300 mg by mouth daily.   . Cholecalciferol (VITAMIN D-3) 1000 UNITS CAPS Take 1 capsule by mouth daily.  Marland Kitchen estradiol (ESTRACE) 2 MG tablet Take 2 mg by mouth daily.  . famotidine (PEPCID) 20 MG tablet Take 20 mg by mouth at bedtime.  . Fluticasone-Salmeterol (ADVAIR) 250-50 MCG/DOSE AEPB Inhale 1 puff into the lungs daily.   . irbesartan (AVAPRO) 150 MG tablet Take 1 tablet (150 mg total) by mouth daily.  .  Multiple Vitamins-Minerals (MULTIVITAMIN ADULT PO) Take 1 tablet by mouth daily. Women's Over 84  . Omega-3 Fatty Acids (FISH OIL) 1200 MG CAPS Take 1 capsule by mouth daily.  . vitamin E 400 UNIT capsule Take 400 Units by mouth daily.  . [DISCONTINUED] ranitidine (ZANTAC) 150 MG tablet Take 150 mg by mouth at bedtime.     Allergies  Allergen Reactions  . Nystatin Diarrhea    Social History   Socioeconomic History  . Marital status: Single    Spouse name: Not on file  . Number of children: Not on file  . Years of education: Not on file  . Highest education level: Not on file  Occupational History  . Not on file  Social Needs  . Financial resource strain: Not on file  . Food insecurity    Worry: Not on file    Inability: Not on file  . Transportation needs    Medical: Not on file    Non-medical: Not on file  Tobacco Use  . Smoking status: Former Smoker    Packs/day: 0.00    Years: 30.00    Pack years: 0.00    Types: Cigarettes    Quit date: 08/14/2014    Years since quitting: 4.8  . Smokeless tobacco: Never Used  Substance and Sexual Activity  . Alcohol use: Yes    Alcohol/week: 4.0 standard drinks    Types: 4 Standard drinks or  equivalent per week  . Drug use: Not on file  . Sexual activity: Not on file  Lifestyle  . Physical activity    Days per week: Not on file    Minutes per session: Not on file  . Stress: Not on file  Relationships  . Social Herbalist on phone: Not on file    Gets together: Not on file    Attends religious service: Not on file    Active member of club or organization: Not on file    Attends meetings of clubs or organizations: Not on file    Relationship status: Not on file  . Intimate partner violence    Fear of current or ex partner: Not on file    Emotionally abused: Not on file    Physically abused: Not on file    Forced sexual activity: Not on file  Other Topics Concern  . Not on file  Social History Narrative  . Not  on file     Review of Systems: General: negative for chills, fever, night sweats or weight changes.  Cardiovascular: negative for chest pain, dyspnea on exertion, edema, orthopnea, palpitations, paroxysmal nocturnal dyspnea or shortness of breath Dermatological: negative for rash Respiratory: negative for cough or wheezing Urologic: negative for hematuria Abdominal: negative for nausea, vomiting, diarrhea, bright red blood per rectum, melena, or hematemesis Neurologic: negative for visual changes, syncope, or dizziness All other systems reviewed and are otherwise negative except as noted above.    Blood pressure 118/70, pulse 75, temperature (!) 97 F (36.1 C), height 5\' 4"  (1.626 m), weight 113 lb (51.3 kg).  General appearance: alert and no distress Neck: no adenopathy, no JVD, supple, symmetrical, trachea midline, thyroid not enlarged, symmetric, no tenderness/mass/nodules and Bilateral carotid bruits, left subclavian bruit Lungs: clear to auscultation bilaterally Heart: regular rate and rhythm, S1, S2 normal, no murmur, click, rub or gallop Extremities: extremities normal, atraumatic, no cyanosis or edema Pulses: 2+ and symmetric Skin: Skin color, texture, turgor normal. No rashes or lesions Neurologic: Alert and oriented X 3, normal strength and tone. Normal symmetric reflexes. Normal coordination and gait  EKG sinus rhythm at 75 with poor R wave progression and low limb voltage. I  Personally reviewed this EKG.  ASSESSMENT AND PLAN:   HTN (hypertension) History of essential hypertension with blood pressure in the left arm measuring 118/70 on Avapro.  Her blood pressure pressures were essentially symmetric and equal bilaterally.  PVD (peripheral vascular disease)- 50% LSCA stenosis by doppler 2012, History of mild left subclavian artery stenosis by duplex ultrasound performed 04/30/2018.  Ended up repeat carotid Doppler studies.  Dizziness No further episodes of dizziness.   Dopplers revealed nonsignificant disease left subclavian and prior event monitor did not show any arrhythmias that would contribute to dizziness.      Lorretta Harp MD FACP,FACC,FAHA, [facility] 07/03/2019 10:23 AM

## 2019-07-05 DIAGNOSIS — Z13220 Encounter for screening for lipoid disorders: Secondary | ICD-10-CM | POA: Diagnosis not present

## 2019-07-05 LAB — HEPATIC FUNCTION PANEL
ALT: 24 IU/L (ref 0–32)
AST: 29 IU/L (ref 0–40)
Albumin: 4.1 g/dL (ref 3.7–4.7)
Alkaline Phosphatase: 59 IU/L (ref 39–117)
Bilirubin Total: 0.5 mg/dL (ref 0.0–1.2)
Bilirubin, Direct: 0.18 mg/dL (ref 0.00–0.40)
Total Protein: 6.2 g/dL (ref 6.0–8.5)

## 2019-07-05 LAB — LIPID PANEL
Chol/HDL Ratio: 1.4 ratio (ref 0.0–4.4)
Cholesterol, Total: 148 mg/dL (ref 100–199)
HDL: 106 mg/dL (ref >39)
LDL Calculated: 33 mg/dL (ref 0–99)
Triglycerides: 43 mg/dL (ref 0–149)
VLDL Cholesterol Cal: 9 mg/dL (ref 5–40)

## 2019-07-08 ENCOUNTER — Encounter: Payer: Self-pay | Admitting: *Deleted

## 2019-07-09 ENCOUNTER — Ambulatory Visit (HOSPITAL_COMMUNITY)
Admission: RE | Admit: 2019-07-09 | Discharge: 2019-07-09 | Disposition: A | Discharge status: Home / Self Care | Payer: Medicare Other | Source: Ambulatory Visit | Attending: Internal Medicine | Admitting: Internal Medicine

## 2019-07-09 ENCOUNTER — Other Ambulatory Visit: Payer: Self-pay

## 2019-07-09 DIAGNOSIS — I739 Peripheral vascular disease, unspecified: Secondary | ICD-10-CM | POA: Diagnosis not present

## 2019-07-10 ENCOUNTER — Other Ambulatory Visit: Payer: Self-pay | Admitting: *Deleted

## 2019-07-10 DIAGNOSIS — Z961 Presence of intraocular lens: Secondary | ICD-10-CM | POA: Diagnosis not present

## 2019-07-10 DIAGNOSIS — H5212 Myopia, left eye: Secondary | ICD-10-CM | POA: Diagnosis not present

## 2019-07-10 DIAGNOSIS — H04123 Dry eye syndrome of bilateral lacrimal glands: Secondary | ICD-10-CM | POA: Diagnosis not present

## 2019-07-10 DIAGNOSIS — I748 Embolism and thrombosis of other arteries: Secondary | ICD-10-CM

## 2019-09-30 DIAGNOSIS — Z124 Encounter for screening for malignant neoplasm of cervix: Secondary | ICD-10-CM | POA: Diagnosis not present

## 2019-09-30 DIAGNOSIS — Z1231 Encounter for screening mammogram for malignant neoplasm of breast: Secondary | ICD-10-CM | POA: Diagnosis not present

## 2019-09-30 DIAGNOSIS — Z6820 Body mass index (BMI) 20.0-20.9, adult: Secondary | ICD-10-CM | POA: Diagnosis not present

## 2019-10-15 DIAGNOSIS — E280 Estrogen excess: Secondary | ICD-10-CM | POA: Diagnosis not present

## 2019-10-15 DIAGNOSIS — N950 Postmenopausal bleeding: Secondary | ICD-10-CM | POA: Diagnosis not present

## 2019-10-28 DIAGNOSIS — N950 Postmenopausal bleeding: Secondary | ICD-10-CM | POA: Diagnosis not present

## 2019-11-04 ENCOUNTER — Other Ambulatory Visit: Payer: Self-pay | Admitting: Obstetrics & Gynecology

## 2019-11-04 DIAGNOSIS — N840 Polyp of corpus uteri: Secondary | ICD-10-CM | POA: Diagnosis not present

## 2019-11-04 DIAGNOSIS — N950 Postmenopausal bleeding: Secondary | ICD-10-CM | POA: Diagnosis not present

## 2019-12-25 DIAGNOSIS — M25552 Pain in left hip: Secondary | ICD-10-CM | POA: Diagnosis not present

## 2019-12-25 DIAGNOSIS — M545 Low back pain, unspecified: Secondary | ICD-10-CM | POA: Insufficient documentation

## 2020-01-07 DIAGNOSIS — M4310 Spondylolisthesis, site unspecified: Secondary | ICD-10-CM | POA: Diagnosis not present

## 2020-01-07 DIAGNOSIS — M545 Low back pain: Secondary | ICD-10-CM | POA: Diagnosis not present

## 2020-01-07 DIAGNOSIS — M5416 Radiculopathy, lumbar region: Secondary | ICD-10-CM | POA: Diagnosis not present

## 2020-01-10 ENCOUNTER — Ambulatory Visit: Payer: Medicare Other | Attending: Internal Medicine

## 2020-01-10 DIAGNOSIS — Z23 Encounter for immunization: Secondary | ICD-10-CM | POA: Insufficient documentation

## 2020-01-10 NOTE — Progress Notes (Signed)
   Covid-19 Vaccination Clinic  Name:  Kim Higgins    MRN: [id number] DOB: [birth year]  01/10/2020  [name] was observed post Covid-19 immunization for 15 minutes without incidence. She was provided with Vaccine Information Sheet and instruction to access the V-Safe system.   [name] was instructed to call 911 with any severe reactions post vaccine: Marland Kitchen Difficulty breathing  . Swelling of your face and throat  . A fast heartbeat  . A bad rash all over your body  . Dizziness and weakness    Immunizations Administered    Name Date Dose VIS Date Route   Pfizer COVID-19 Vaccine 01/10/2020  8:42 AM 0.3 mL 10/25/2019 Intramuscular   Manufacturer: [place]   Lot: [id number]   [place]: [id number]

## 2020-01-17 ENCOUNTER — Other Ambulatory Visit: Payer: Self-pay | Admitting: Cardiovascular Disease

## 2020-01-17 MED ORDER — IRBESARTAN 150 MG PO TABS: 150.0000 mg | ORAL_TABLET | Freq: Every day | ORAL | 0 refills | Status: DC

## 2020-01-17 NOTE — Telephone Encounter (Signed)
*  STAT* If patient is at the pharmacy, call can be transferred to refill team.   1. Which medications need to be refilled? (please list name of each medication and dose if known) Irbesartan  2. Which pharmacy/location (including street and city if local pharmacy) is medication to be sent to? [facility]- [phone]  3. Do they need a 30 day or 90 day supply? 90 days and refills

## 2020-01-20 DIAGNOSIS — M545 Low back pain: Secondary | ICD-10-CM | POA: Diagnosis not present

## 2020-02-03 DIAGNOSIS — M545 Low back pain: Secondary | ICD-10-CM | POA: Diagnosis not present

## 2020-02-04 ENCOUNTER — Ambulatory Visit: Payer: Medicare Other | Attending: Internal Medicine

## 2020-02-04 DIAGNOSIS — Z23 Encounter for immunization: Secondary | ICD-10-CM

## 2020-02-04 NOTE — Progress Notes (Signed)
   Covid-19 Vaccination Clinic  Name:  Kim Higgins    MRN: [id number] DOB: Jun 05, 1946  02/04/2020  [name] was observed post Covid-19 immunization for 15 minutes without incident. She was provided with Vaccine Information Sheet and instruction to access the V-Safe system.   [name] was instructed to call 911 with any severe reactions post vaccine: Marland Kitchen Difficulty breathing  . Swelling of face and throat  . A fast heartbeat  . A bad rash all over body  . Dizziness and weakness   Immunizations Administered    Name Date Dose VIS Date Route   Pfizer COVID-19 Vaccine 02/04/2020 10:19 AM 0.3 mL 10/25/2019 Intramuscular   Manufacturer: [place]   Lot: [id number]   [place]: [id number]

## 2020-02-06 DIAGNOSIS — M545 Low back pain: Secondary | ICD-10-CM | POA: Diagnosis not present

## 2020-02-06 DIAGNOSIS — M4310 Spondylolisthesis, site unspecified: Secondary | ICD-10-CM | POA: Diagnosis not present

## 2020-02-06 DIAGNOSIS — M5416 Radiculopathy, lumbar region: Secondary | ICD-10-CM | POA: Diagnosis not present

## 2020-02-06 DIAGNOSIS — M5136 Other intervertebral disc degeneration, lumbar region: Secondary | ICD-10-CM | POA: Diagnosis not present

## 2020-02-10 DIAGNOSIS — M545 Low back pain: Secondary | ICD-10-CM | POA: Diagnosis not present

## 2020-02-14 DIAGNOSIS — M545 Low back pain: Secondary | ICD-10-CM | POA: Diagnosis not present

## 2020-02-19 DIAGNOSIS — M5416 Radiculopathy, lumbar region: Secondary | ICD-10-CM | POA: Diagnosis not present

## 2020-02-21 DIAGNOSIS — M5416 Radiculopathy, lumbar region: Secondary | ICD-10-CM | POA: Diagnosis not present

## 2020-02-25 DIAGNOSIS — M5416 Radiculopathy, lumbar region: Secondary | ICD-10-CM | POA: Diagnosis not present

## 2020-02-28 DIAGNOSIS — M5416 Radiculopathy, lumbar region: Secondary | ICD-10-CM | POA: Diagnosis not present

## 2020-02-28 DIAGNOSIS — M545 Low back pain: Secondary | ICD-10-CM | POA: Diagnosis not present

## 2020-03-02 DIAGNOSIS — M5416 Radiculopathy, lumbar region: Secondary | ICD-10-CM | POA: Diagnosis not present

## 2020-03-02 DIAGNOSIS — M545 Low back pain: Secondary | ICD-10-CM | POA: Diagnosis not present

## 2020-03-09 DIAGNOSIS — M545 Low back pain: Secondary | ICD-10-CM | POA: Diagnosis not present

## 2020-04-09 ENCOUNTER — Other Ambulatory Visit: Payer: Self-pay | Admitting: Cardiovascular Disease

## 2020-04-15 ENCOUNTER — Other Ambulatory Visit: Payer: Self-pay

## 2020-04-15 MED ORDER — IRBESARTAN 150 MG PO TABS: 150.0000 mg | ORAL_TABLET | Freq: Every day | ORAL | 0 refills | Status: DC

## 2020-04-29 DIAGNOSIS — I10 Essential (primary) hypertension: Secondary | ICD-10-CM | POA: Diagnosis not present

## 2020-04-29 DIAGNOSIS — I739 Peripheral vascular disease, unspecified: Secondary | ICD-10-CM | POA: Diagnosis not present

## 2020-05-04 DIAGNOSIS — I739 Peripheral vascular disease, unspecified: Secondary | ICD-10-CM | POA: Diagnosis not present

## 2020-05-04 DIAGNOSIS — R718 Other abnormality of red blood cells: Secondary | ICD-10-CM | POA: Diagnosis not present

## 2020-05-04 DIAGNOSIS — I6523 Occlusion and stenosis of bilateral carotid arteries: Secondary | ICD-10-CM | POA: Diagnosis not present

## 2020-07-07 ENCOUNTER — Other Ambulatory Visit: Payer: Self-pay

## 2020-07-07 ENCOUNTER — Encounter: Payer: Self-pay | Admitting: Cardiovascular Disease

## 2020-07-07 ENCOUNTER — Ambulatory Visit (INDEPENDENT_AMBULATORY_CARE_PROVIDER_SITE_OTHER): Payer: Medicare Other | Admitting: Cardiovascular Disease

## 2020-07-07 DIAGNOSIS — I739 Peripheral vascular disease, unspecified: Secondary | ICD-10-CM

## 2020-07-07 DIAGNOSIS — I10 Essential (primary) hypertension: Secondary | ICD-10-CM

## 2020-07-07 NOTE — Assessment & Plan Note (Signed)
History of peripheral arterial disease the question of left subclavian artery stenosis with a 20 mm upper extremity blood pressure difference and Dopplers that showed turbulent flow.  There is no evidence of ICA stenosis.  She does get occasional brief dizziness on a weekly basis.  We talked about doing a CT angiogram which she wants to think about.

## 2020-07-07 NOTE — Patient Instructions (Signed)

## 2020-07-07 NOTE — Assessment & Plan Note (Signed)
History of essential hypertension a blood pressure measured at 130/84.  She is on Avapro.

## 2020-07-07 NOTE — Progress Notes (Signed)
07/07/2020 Kim Higgins   Dec 19, 1945  [id number]  Primary Physician Leeroy Cha, MD Primary Cardiologist: Lorretta Harp MD FACP, [place], [place], [place]  HPI:  Kim Higgins is a 74 y.o.  who I last saw in the office  07/03/2019.She was remotely a patient of Dr. Golden Hurter. 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 She has a history of hypertension, discontinued tobacco abuse and mild left subclavian artery stenosis. She denies chest pain but does get shortness of breath from COPD improved on inhaled bronchodilators. Since I saw her year ago she denies chest pain or shortness of breath.  When I saw her last year, she was complaining of new onset episodic dizziness without syncope. Recent Holter monitor did not show any arrhythmias that would be implicated in this except for occasional PVCs and a short atrial run. She did not have any episodes while wearing the Holter monitor. She does have left subclavian artery stenosis which could be causing the symptoms as well.  Since I saw her a year ago she gets occasional episodes of brief dizziness on a weekly basis.  Her most recent Doppler studies performed 07/09/2019 revealed no evidence of ICA stenosis with turbulent flow in her left subclavian artery but her vertebral flow is antegrade.  She otherwise denies chest pain or shortness of breath..   Current Meds  Medication Sig  . albuterol-ipratropium (COMBIVENT) 18-103 MCG/ACT inhaler Inhale 1 puff into the lungs as needed for wheezing or shortness of breath.  . ASCORBIC ACID PO Take 350 mg by mouth daily.  Marland Kitchen b complex vitamins tablet Take 1 tablet by mouth daily.  Marland Kitchen buPROPion (WELLBUTRIN SR) 150 MG 12 hr tablet Take 300 mg by mouth daily.   . Cholecalciferol (VITAMIN D-3) 1000 UNITS CAPS Take 1 capsule by mouth daily.  Marland Kitchen estradiol (ESTRACE) 2 MG tablet Take 2 mg by mouth daily.  . famotidine (PEPCID) 20 MG tablet  Take 20 mg by mouth at bedtime.  . irbesartan (AVAPRO) 150 MG tablet Take 1 tablet (150 mg total) by mouth daily.  . Omega-3 Fatty Acids (FISH OIL) 1200 MG CAPS Take 1 capsule by mouth daily.  . [DISCONTINUED] Fluticasone-Salmeterol (ADVAIR) 250-50 MCG/DOSE AEPB Inhale 1 puff into the lungs daily.   . [DISCONTINUED] Multiple Vitamins-Minerals (MULTIVITAMIN ADULT PO) Take 1 tablet by mouth daily. Women's Over 104  . [DISCONTINUED] vitamin E 400 UNIT capsule Take 400 Units by mouth daily.     Allergies  Allergen Reactions  . Nystatin Diarrhea    Social History   Socioeconomic History  . Marital status: Single    Spouse name: Not on file  . Number of children: Not on file  . Years of education: Not on file  . Highest education level: Not on file  Occupational History  . Not on file  Tobacco Use  . Smoking status: Former Smoker    Packs/day: 0.00    Years: 30.00    Pack years: 0.00    Types: Cigarettes    Quit date: 08/14/2014    Years since quitting: 5.9  . Smokeless tobacco: Never Used  Substance and Sexual Activity  . Alcohol use: Yes    Alcohol/week: 4.0 standard drinks    Types: 4 Standard drinks or equivalent per week  . Drug use: Not on file  . Sexual activity: Not on file  Other Topics Concern  . Not on file  Social History Narrative  .  Not on file   Social Determinants of Health   Financial Resource Strain:   . Difficulty of Paying Living Expenses: Not on file  Food Insecurity:   . Worried About Charity fundraiser in the Last Year: Not on file  . Ran Out of Food in the Last Year: Not on file  Transportation Needs:   . Lack of Transportation (Medical): Not on file  . Lack of Transportation (Non-Medical): Not on file  Physical Activity:   . Days of Exercise per Week: Not on file  . Minutes of Exercise per Session: Not on file  Stress:   . Feeling of Stress : Not on file  Social Connections:   . Frequency of Communication with Friends and Family: Not on  file  . Frequency of Social Gatherings with Friends and Family: Not on file  . Attends Religious Services: Not on file  . Active Member of Clubs or Organizations: Not on file  . Attends Archivist Meetings: Not on file  . Marital Status: Not on file  Intimate Partner Violence:   . Fear of Current or Ex-Partner: Not on file  . Emotionally Abused: Not on file  . Physically Abused: Not on file  . Sexually Abused: Not on file     Review of Systems: General: negative for chills, fever, night sweats or weight changes.  Cardiovascular: negative for chest pain, dyspnea on exertion, edema, orthopnea, palpitations, paroxysmal nocturnal dyspnea or shortness of breath Dermatological: negative for rash Respiratory: negative for cough or wheezing Urologic: negative for hematuria Abdominal: negative for nausea, vomiting, diarrhea, bright red blood per rectum, melena, or hematemesis Neurologic: negative for visual changes, syncope, or dizziness All other systems reviewed and are otherwise negative except as noted above.    Blood pressure 130/84, pulse 72, height 5\' 4"  (1.626 m), weight 117 lb 12.8 oz (53.4 kg).  General appearance: alert and no distress Neck: no adenopathy, no JVD, supple, symmetrical, trachea midline, thyroid not enlarged, symmetric, no tenderness/mass/nodules and Left carotid bruit Lungs: clear to auscultation bilaterally Heart: regular rate and rhythm, S1, S2 normal, no murmur, click, rub or gallop Extremities: extremities normal, atraumatic, no cyanosis or edema Pulses: 2+ and symmetric Skin: Skin color, texture, turgor normal. No rashes or lesions Neurologic: Alert and oriented X 3, normal strength and tone. Normal symmetric reflexes. Normal coordination and gait  EKG sinus rhythm at 72 without ST or T wave changes.  I personally reviewed this EKG.  ASSESSMENT AND PLAN:   HTN (hypertension) History of essential hypertension a blood pressure measured at 130/84.   She is on Avapro.  PVD (peripheral vascular disease)- 50% LSCA stenosis by doppler 2012, History of peripheral arterial disease the question of left subclavian artery stenosis with a 20 mm upper extremity blood pressure difference and Dopplers that showed turbulent flow.  There is no evidence of ICA stenosis.  She does get occasional brief dizziness on a weekly basis.  We talked about doing a CT angiogram which she wants to think about.      Lorretta Harp MD FACP,FACC,FAHA, [facility] 07/07/2020 2:34 PM

## 2020-07-08 NOTE — Addendum Note (Signed)
Addended by: Darlen Round on: 07/08/2020 09:56 AM   Modules accepted: Orders

## 2020-07-10 DIAGNOSIS — H353131 Nonexudative age-related macular degeneration, bilateral, early dry stage: Secondary | ICD-10-CM | POA: Diagnosis not present

## 2020-07-10 DIAGNOSIS — H524 Presbyopia: Secondary | ICD-10-CM | POA: Diagnosis not present

## 2020-07-10 DIAGNOSIS — Z961 Presence of intraocular lens: Secondary | ICD-10-CM | POA: Diagnosis not present

## 2020-07-10 DIAGNOSIS — H04123 Dry eye syndrome of bilateral lacrimal glands: Secondary | ICD-10-CM | POA: Diagnosis not present

## 2020-07-13 ENCOUNTER — Other Ambulatory Visit: Payer: Self-pay | Admitting: Cardiovascular Disease

## 2020-07-13 ENCOUNTER — Ambulatory Visit (HOSPITAL_COMMUNITY)
Admission: RE | Admit: 2020-07-13 | Payer: Medicare Other | Source: Ambulatory Visit | Attending: Cardiovascular Disease | Admitting: Cardiovascular Disease

## 2020-09-30 DIAGNOSIS — N762 Acute vulvitis: Secondary | ICD-10-CM | POA: Diagnosis not present

## 2020-09-30 DIAGNOSIS — Z01419 Encounter for gynecological examination (general) (routine) without abnormal findings: Secondary | ICD-10-CM | POA: Diagnosis not present

## 2020-09-30 DIAGNOSIS — Z6820 Body mass index (BMI) 20.0-20.9, adult: Secondary | ICD-10-CM | POA: Diagnosis not present

## 2020-09-30 DIAGNOSIS — Z1231 Encounter for screening mammogram for malignant neoplasm of breast: Secondary | ICD-10-CM | POA: Diagnosis not present

## 2020-09-30 DIAGNOSIS — L659 Nonscarring hair loss, unspecified: Secondary | ICD-10-CM | POA: Diagnosis not present

## 2020-10-28 DIAGNOSIS — J449 Chronic obstructive pulmonary disease, unspecified: Secondary | ICD-10-CM | POA: Diagnosis not present

## 2020-10-28 DIAGNOSIS — I6523 Occlusion and stenosis of bilateral carotid arteries: Secondary | ICD-10-CM | POA: Diagnosis not present

## 2020-10-28 DIAGNOSIS — I10 Essential (primary) hypertension: Secondary | ICD-10-CM | POA: Diagnosis not present

## 2020-10-28 DIAGNOSIS — K219 Gastro-esophageal reflux disease without esophagitis: Secondary | ICD-10-CM | POA: Diagnosis not present

## 2020-10-28 DIAGNOSIS — Z23 Encounter for immunization: Secondary | ICD-10-CM | POA: Diagnosis not present

## 2020-11-23 DIAGNOSIS — Z23 Encounter for immunization: Secondary | ICD-10-CM | POA: Diagnosis not present

## 2020-12-06 DIAGNOSIS — I10 Essential (primary) hypertension: Secondary | ICD-10-CM | POA: Diagnosis not present

## 2020-12-06 DIAGNOSIS — K219 Gastro-esophageal reflux disease without esophagitis: Secondary | ICD-10-CM | POA: Diagnosis not present

## 2020-12-06 DIAGNOSIS — M4726 Other spondylosis with radiculopathy, lumbar region: Secondary | ICD-10-CM | POA: Diagnosis not present

## 2020-12-06 DIAGNOSIS — J449 Chronic obstructive pulmonary disease, unspecified: Secondary | ICD-10-CM | POA: Diagnosis not present

## 2020-12-11 DIAGNOSIS — I10 Essential (primary) hypertension: Secondary | ICD-10-CM | POA: Diagnosis not present

## 2020-12-11 DIAGNOSIS — F418 Other specified anxiety disorders: Secondary | ICD-10-CM | POA: Diagnosis not present

## 2020-12-11 DIAGNOSIS — M4726 Other spondylosis with radiculopathy, lumbar region: Secondary | ICD-10-CM | POA: Diagnosis not present

## 2020-12-11 DIAGNOSIS — J449 Chronic obstructive pulmonary disease, unspecified: Secondary | ICD-10-CM | POA: Diagnosis not present

## 2021-01-11 DIAGNOSIS — I10 Essential (primary) hypertension: Secondary | ICD-10-CM | POA: Diagnosis not present

## 2021-01-11 DIAGNOSIS — Z681 Body mass index (BMI) 19 or less, adult: Secondary | ICD-10-CM | POA: Diagnosis not present

## 2021-01-11 DIAGNOSIS — M5416 Radiculopathy, lumbar region: Secondary | ICD-10-CM | POA: Diagnosis not present

## 2021-02-23 DIAGNOSIS — J302 Other seasonal allergic rhinitis: Secondary | ICD-10-CM | POA: Diagnosis not present

## 2021-04-30 DIAGNOSIS — I10 Essential (primary) hypertension: Secondary | ICD-10-CM | POA: Diagnosis not present

## 2021-04-30 DIAGNOSIS — Z23 Encounter for immunization: Secondary | ICD-10-CM | POA: Diagnosis not present

## 2021-04-30 DIAGNOSIS — I6523 Occlusion and stenosis of bilateral carotid arteries: Secondary | ICD-10-CM | POA: Diagnosis not present

## 2021-05-05 DIAGNOSIS — E2839 Other primary ovarian failure: Secondary | ICD-10-CM | POA: Diagnosis not present

## 2021-05-05 DIAGNOSIS — M4726 Other spondylosis with radiculopathy, lumbar region: Secondary | ICD-10-CM | POA: Diagnosis not present

## 2021-05-05 DIAGNOSIS — I10 Essential (primary) hypertension: Secondary | ICD-10-CM | POA: Diagnosis not present

## 2021-05-05 DIAGNOSIS — I6523 Occlusion and stenosis of bilateral carotid arteries: Secondary | ICD-10-CM | POA: Diagnosis not present

## 2021-05-05 DIAGNOSIS — G4452 New daily persistent headache (NDPH): Secondary | ICD-10-CM | POA: Diagnosis not present

## 2021-05-05 DIAGNOSIS — Z0000 Encounter for general adult medical examination without abnormal findings: Secondary | ICD-10-CM | POA: Diagnosis not present

## 2021-05-05 DIAGNOSIS — R748 Abnormal levels of other serum enzymes: Secondary | ICD-10-CM | POA: Diagnosis not present

## 2021-05-05 DIAGNOSIS — K219 Gastro-esophageal reflux disease without esophagitis: Secondary | ICD-10-CM | POA: Diagnosis not present

## 2021-05-05 DIAGNOSIS — Z1389 Encounter for screening for other disorder: Secondary | ICD-10-CM | POA: Diagnosis not present

## 2021-07-07 ENCOUNTER — Other Ambulatory Visit: Payer: Self-pay

## 2021-07-07 ENCOUNTER — Ambulatory Visit (INDEPENDENT_AMBULATORY_CARE_PROVIDER_SITE_OTHER): Payer: Medicare Other | Admitting: Cardiovascular Disease

## 2021-07-07 ENCOUNTER — Encounter: Payer: Self-pay | Admitting: Cardiovascular Disease

## 2021-07-07 VITALS — BP 121/73 | HR 77 | Ht 64.0 in | Wt 110.8 lb

## 2021-07-07 DIAGNOSIS — I739 Peripheral vascular disease, unspecified: Secondary | ICD-10-CM

## 2021-07-07 DIAGNOSIS — I10 Essential (primary) hypertension: Secondary | ICD-10-CM

## 2021-07-07 DIAGNOSIS — F17200 Nicotine dependence, unspecified, uncomplicated: Secondary | ICD-10-CM

## 2021-07-07 DIAGNOSIS — R0989 Other specified symptoms and signs involving the circulatory and respiratory systems: Secondary | ICD-10-CM | POA: Diagnosis not present

## 2021-07-07 NOTE — Patient Instructions (Signed)
Medication Instructions:  The current medical regimen is effective;  continue present plan and medications.  *If you need a refill on your cardiac medications before your next appointment, please call your pharmacy*   Testing/Procedures: Your physician has requested that you have a carotid duplex. This test is an ultrasound of the carotid arteries in your neck. It looks at blood flow through these arteries that supply the brain with blood. Allow one hour for this exam. There are no restrictions or special instructions.    Follow-Up: At [facility], you and your health needs are our priority.  As part of our continuing mission to provide you with exceptional heart care, we have created designated Provider Care Teams.  These Care Teams include your primary Cardiologist (physician) and Advanced Practice Providers (APPs -  Physician Assistants and Nurse Practitioners) who all work together to provide you with the care you need, when you need it.  We recommend signing up for the patient portal called "MyChart".  Sign up information is provided on this After Visit Summary.  MyChart is used to connect with patients for Virtual Visits (Telemedicine).  Patients are able to view lab/test results, encounter notes, upcoming appointments, etc.  Non-urgent messages can be sent to your provider as well.   To learn more about what you can do with MyChart, go to [email or web address].    Your next appointment:   12 month(s)  The format for your next appointment:   In Person  Provider:   Quay Burow, MD

## 2021-07-07 NOTE — Assessment & Plan Note (Signed)
History of essential hypertension a blood pressure measured today at 121/73.  She is on Avapro.

## 2021-07-07 NOTE — Assessment & Plan Note (Signed)
History of left subclavian artery stenosis with a 20 mm upper extremity blood pressure difference and Doppler studies performed 07/09/2019 that did not show increased velocities but did show turbulent blood flow with antegrade left vertebral blood flow.  She denies upper extremity claudication but does get occasionally dizzy.  We will continue to follow this.  She has a loud bruit on exam.

## 2021-07-07 NOTE — Assessment & Plan Note (Signed)
Discontinued

## 2021-07-07 NOTE — Progress Notes (Signed)
07/07/2021 Kim Higgins   10/17/1946  [id number]  Primary Physician Leeroy Cha, MD Primary Cardiologist: Lorretta Harp MD Gar[facility] Reddish, [place], [place]  HPI:  Kim Higgins is a 75 y.o. who I last saw in the office 07/07/2020. She was remotely a patient of Dr. Golden Hurter. 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 She has a history of hypertension, discontinued tobacco abuse and mild left subclavian artery stenosis. She denies chest pain but does get shortness of breath from COPD improved on inhaled bronchodilators. Since I saw her year ago she denies chest pain or shortness of breath.    When I saw her last year, she was complaining of new onset episodic dizziness without syncope.  Recent Holter monitor did not show any arrhythmias that would be implicated in this except for occasional PVCs and a short atrial run.  She did not have any episodes while wearing the Holter monitor.  She does have left subclavian artery stenosis which could be causing the symptoms as well.   Since I saw her a year ago she gets occasional episodes of brief dizziness on a weekly basis.  Her most recent Doppler studies performed 07/09/2019 revealed no evidence of ICA stenosis with turbulent flow in her left subclavian artery but her vertebral flow is antegrade.  She otherwise denies chest pain or shortness of breath.  Her most recent lipid profile performed by her PCP 05/01/2021 revealed total cholesterol 174, LDL 60 and HDL of 103.   Current Meds  Medication Sig   albuterol-ipratropium (COMBIVENT) 18-103 MCG/ACT inhaler Inhale 1 puff into the lungs as needed for wheezing or shortness of breath.   ASCORBIC ACID PO Take 350 mg by mouth daily.   b complex vitamins tablet Take 1 tablet by mouth daily.   buPROPion (WELLBUTRIN SR) 150 MG 12 hr tablet Take 300 mg by mouth daily.    Cholecalciferol (VITAMIN D-3) 1000 UNITS CAPS Take 1 capsule by mouth  daily.   cyclobenzaprine (FLEXERIL) 10 MG tablet Take 10 mg by mouth 2 (two) times daily as needed.   estradiol (ESTRACE) 2 MG tablet Take 2 mg by mouth daily.   famotidine (PEPCID) 20 MG tablet Take 20 mg by mouth at bedtime.   fluticasone-salmeterol (ADVAIR) 250-50 MCG/ACT AEPB Inhale into the lungs.   irbesartan (AVAPRO) 150 MG tablet TAKE 1 TABLET(150 MG) BY MOUTH DAILY   Omega-3 Fatty Acids (FISH OIL) 1200 MG CAPS Take 1 capsule by mouth daily.   progesterone (PROMETRIUM) 100 MG capsule Take 1 capsule by mouth once. Once monthly     Allergies  Allergen Reactions   Nystatin Diarrhea    Social History   Socioeconomic History   Marital status: Single    Spouse name: Not on file   Number of children: Not on file   Years of education: Not on file   Highest education level: Not on file  Occupational History   Not on file  Tobacco Use   Smoking status: Former    Packs/day: 0.00    Years: 30.00    Pack years: 0.00    Types: Cigarettes    Quit date: 08/14/2014    Years since quitting: 6.9   Smokeless tobacco: Never  Substance and Sexual Activity   Alcohol use: Yes    Alcohol/week: 4.0 standard drinks    Types: 4 Standard drinks or equivalent per week   Drug use: Not on file  Sexual activity: Not on file  Other Topics Concern   Not on file  Social History Narrative   Not on file   Social Determinants of Health   Financial Resource Strain: Not on file  Food Insecurity: Not on file  Transportation Needs: Not on file  Physical Activity: Not on file  Stress: Not on file  Social Connections: Not on file  Intimate Partner Violence: Not on file     Review of Systems: General: negative for chills, fever, night sweats or weight changes.  Cardiovascular: negative for chest pain, dyspnea on exertion, edema, orthopnea, palpitations, paroxysmal nocturnal dyspnea or shortness of breath Dermatological: negative for rash Respiratory: negative for cough or wheezing Urologic:  negative for hematuria Abdominal: negative for nausea, vomiting, diarrhea, bright red blood per rectum, melena, or hematemesis Neurologic: negative for visual changes, syncope, or dizziness All other systems reviewed and are otherwise negative except as noted above.    Blood pressure 121/73, pulse 77, height '5\' 4"'$  (1.626 m), weight 110 lb 12.8 oz (50.3 kg), SpO2 99 %.  General appearance: alert and no distress Neck: no adenopathy, no JVD, supple, symmetrical, trachea midline, thyroid not enlarged, symmetric, no tenderness/mass/nodules, and left carotid/subclavian bruit Lungs: clear to auscultation bilaterally Heart: regular rate and rhythm, S1, S2 normal, no murmur, click, rub or gallop Extremities: extremities normal, atraumatic, no cyanosis or edema Pulses: 2+ and symmetric Skin: Skin color, texture, turgor normal. No rashes or lesions Neurologic: Grossly normal  EKG sinus rhythm at 77 without ST or T wave changes.  There was poor R wave progression.  Personally reviewed this EKG.  ASSESSMENT AND PLAN:   HTN (hypertension) History of essential hypertension a blood pressure measured today at 121/73.  She is on Avapro.  PVD (peripheral vascular disease)- 50% LSCA stenosis by doppler 2012, History of left subclavian artery stenosis with a 20 mm upper extremity blood pressure difference and Doppler studies performed 07/09/2019 that did not show increased velocities but did show turbulent blood flow with antegrade left vertebral blood flow.  She denies upper extremity claudication but does get occasionally dizzy.  We will continue to follow this.  She has a loud bruit on exam.  Smoker Discontinued     Lorretta Harp MD [facility], [facility] 07/07/2021 11:44 AM

## 2021-07-09 ENCOUNTER — Other Ambulatory Visit (HOSPITAL_COMMUNITY): Payer: Self-pay | Admitting: Cardiovascular Disease

## 2021-07-09 ENCOUNTER — Other Ambulatory Visit: Payer: Self-pay

## 2021-07-09 ENCOUNTER — Ambulatory Visit (HOSPITAL_COMMUNITY)
Admission: RE | Admit: 2021-07-09 | Discharge: 2021-07-09 | Disposition: A | Discharge status: Home / Self Care | Payer: Medicare Other | Source: Ambulatory Visit | Attending: Cardiology | Admitting: Cardiology

## 2021-07-09 DIAGNOSIS — R0989 Other specified symptoms and signs involving the circulatory and respiratory systems: Secondary | ICD-10-CM

## 2021-07-09 DIAGNOSIS — I6523 Occlusion and stenosis of bilateral carotid arteries: Secondary | ICD-10-CM

## 2021-07-14 DIAGNOSIS — L308 Other specified dermatitis: Secondary | ICD-10-CM | POA: Diagnosis not present

## 2021-07-14 DIAGNOSIS — H5212 Myopia, left eye: Secondary | ICD-10-CM | POA: Diagnosis not present

## 2021-07-14 DIAGNOSIS — H353132 Nonexudative age-related macular degeneration, bilateral, intermediate dry stage: Secondary | ICD-10-CM | POA: Diagnosis not present

## 2021-07-14 DIAGNOSIS — Z961 Presence of intraocular lens: Secondary | ICD-10-CM | POA: Diagnosis not present

## 2021-07-14 DIAGNOSIS — H04123 Dry eye syndrome of bilateral lacrimal glands: Secondary | ICD-10-CM | POA: Diagnosis not present

## 2021-07-14 DIAGNOSIS — M6740 Ganglion, unspecified site: Secondary | ICD-10-CM | POA: Diagnosis not present

## 2021-08-02 ENCOUNTER — Encounter: Payer: Self-pay | Admitting: *Deleted

## 2021-09-06 DIAGNOSIS — I10 Essential (primary) hypertension: Secondary | ICD-10-CM | POA: Diagnosis not present

## 2021-09-06 DIAGNOSIS — J449 Chronic obstructive pulmonary disease, unspecified: Secondary | ICD-10-CM | POA: Diagnosis not present

## 2021-09-06 DIAGNOSIS — K219 Gastro-esophageal reflux disease without esophagitis: Secondary | ICD-10-CM | POA: Diagnosis not present

## 2021-09-06 DIAGNOSIS — M4726 Other spondylosis with radiculopathy, lumbar region: Secondary | ICD-10-CM | POA: Diagnosis not present

## 2021-09-29 DIAGNOSIS — F3341 Major depressive disorder, recurrent, in partial remission: Secondary | ICD-10-CM | POA: Diagnosis not present

## 2021-09-29 DIAGNOSIS — M4726 Other spondylosis with radiculopathy, lumbar region: Secondary | ICD-10-CM | POA: Diagnosis not present

## 2021-09-29 DIAGNOSIS — I10 Essential (primary) hypertension: Secondary | ICD-10-CM | POA: Diagnosis not present

## 2021-09-29 DIAGNOSIS — Z23 Encounter for immunization: Secondary | ICD-10-CM | POA: Diagnosis not present

## 2021-09-29 DIAGNOSIS — J449 Chronic obstructive pulmonary disease, unspecified: Secondary | ICD-10-CM | POA: Diagnosis not present

## 2021-10-05 DIAGNOSIS — Z681 Body mass index (BMI) 19 or less, adult: Secondary | ICD-10-CM | POA: Diagnosis not present

## 2021-10-05 DIAGNOSIS — Z124 Encounter for screening for malignant neoplasm of cervix: Secondary | ICD-10-CM | POA: Diagnosis not present

## 2021-10-05 DIAGNOSIS — Z1231 Encounter for screening mammogram for malignant neoplasm of breast: Secondary | ICD-10-CM | POA: Diagnosis not present

## 2021-10-05 DIAGNOSIS — N760 Acute vaginitis: Secondary | ICD-10-CM | POA: Diagnosis not present

## 2021-10-12 DIAGNOSIS — F419 Anxiety disorder, unspecified: Secondary | ICD-10-CM | POA: Diagnosis not present

## 2021-10-12 DIAGNOSIS — R945 Abnormal results of liver function studies: Secondary | ICD-10-CM | POA: Diagnosis not present

## 2021-10-12 DIAGNOSIS — H53483 Generalized contraction of visual field, bilateral: Secondary | ICD-10-CM | POA: Diagnosis not present

## 2021-10-12 DIAGNOSIS — I10 Essential (primary) hypertension: Secondary | ICD-10-CM | POA: Diagnosis not present

## 2021-11-14 DIAGNOSIS — J189 Pneumonia, unspecified organism: Secondary | ICD-10-CM

## 2021-11-14 HISTORY — DX: Pneumonia, unspecified organism: J18.9

## 2021-12-01 DIAGNOSIS — M5442 Lumbago with sciatica, left side: Secondary | ICD-10-CM | POA: Diagnosis not present

## 2021-12-01 DIAGNOSIS — M542 Cervicalgia: Secondary | ICD-10-CM | POA: Diagnosis not present

## 2021-12-13 DIAGNOSIS — J449 Chronic obstructive pulmonary disease, unspecified: Secondary | ICD-10-CM | POA: Diagnosis not present

## 2021-12-13 DIAGNOSIS — J069 Acute upper respiratory infection, unspecified: Secondary | ICD-10-CM | POA: Diagnosis not present

## 2021-12-13 DIAGNOSIS — Z03818 Encounter for observation for suspected exposure to other biological agents ruled out: Secondary | ICD-10-CM | POA: Diagnosis not present

## 2021-12-13 DIAGNOSIS — R059 Cough, unspecified: Secondary | ICD-10-CM | POA: Diagnosis not present

## 2021-12-20 ENCOUNTER — Ambulatory Visit
Admission: RE | Admit: 2021-12-20 | Discharge: 2021-12-20 | Disposition: A | Discharge status: Home / Self Care | Payer: Medicare Other | Source: Ambulatory Visit | Attending: Internal Medicine | Admitting: Internal Medicine

## 2021-12-20 ENCOUNTER — Other Ambulatory Visit: Payer: Self-pay

## 2021-12-20 ENCOUNTER — Other Ambulatory Visit: Payer: Self-pay | Admitting: Internal Medicine

## 2021-12-20 DIAGNOSIS — R091 Pleurisy: Secondary | ICD-10-CM

## 2021-12-20 DIAGNOSIS — J069 Acute upper respiratory infection, unspecified: Secondary | ICD-10-CM | POA: Diagnosis not present

## 2021-12-20 DIAGNOSIS — R0602 Shortness of breath: Secondary | ICD-10-CM | POA: Diagnosis not present

## 2021-12-20 DIAGNOSIS — R0781 Pleurodynia: Secondary | ICD-10-CM | POA: Diagnosis not present

## 2021-12-20 DIAGNOSIS — J189 Pneumonia, unspecified organism: Secondary | ICD-10-CM | POA: Diagnosis not present

## 2021-12-20 DIAGNOSIS — R059 Cough, unspecified: Secondary | ICD-10-CM | POA: Diagnosis not present

## 2021-12-20 DIAGNOSIS — R42 Dizziness and giddiness: Secondary | ICD-10-CM | POA: Diagnosis not present

## 2021-12-20 DIAGNOSIS — J449 Chronic obstructive pulmonary disease, unspecified: Secondary | ICD-10-CM | POA: Diagnosis not present

## 2021-12-20 DIAGNOSIS — R630 Anorexia: Secondary | ICD-10-CM | POA: Diagnosis not present

## 2022-01-17 ENCOUNTER — Other Ambulatory Visit: Payer: Self-pay | Admitting: Internal Medicine

## 2022-01-17 ENCOUNTER — Ambulatory Visit
Admission: RE | Admit: 2022-01-17 | Discharge: 2022-01-17 | Disposition: A | Discharge status: Home / Self Care | Payer: Medicare Other | Source: Ambulatory Visit | Attending: Internal Medicine | Admitting: Internal Medicine

## 2022-01-17 DIAGNOSIS — Z8701 Personal history of pneumonia (recurrent): Secondary | ICD-10-CM

## 2022-01-17 DIAGNOSIS — R42 Dizziness and giddiness: Secondary | ICD-10-CM | POA: Diagnosis not present

## 2022-01-17 DIAGNOSIS — I10 Essential (primary) hypertension: Secondary | ICD-10-CM | POA: Diagnosis not present

## 2022-01-31 DIAGNOSIS — Z03818 Encounter for observation for suspected exposure to other biological agents ruled out: Secondary | ICD-10-CM | POA: Diagnosis not present

## 2022-01-31 DIAGNOSIS — J441 Chronic obstructive pulmonary disease with (acute) exacerbation: Secondary | ICD-10-CM | POA: Diagnosis not present

## 2022-01-31 DIAGNOSIS — J449 Chronic obstructive pulmonary disease, unspecified: Secondary | ICD-10-CM | POA: Diagnosis not present

## 2022-02-28 DIAGNOSIS — K219 Gastro-esophageal reflux disease without esophagitis: Secondary | ICD-10-CM | POA: Diagnosis not present

## 2022-02-28 DIAGNOSIS — I10 Essential (primary) hypertension: Secondary | ICD-10-CM | POA: Diagnosis not present

## 2022-02-28 DIAGNOSIS — J441 Chronic obstructive pulmonary disease with (acute) exacerbation: Secondary | ICD-10-CM | POA: Diagnosis not present

## 2022-02-28 DIAGNOSIS — F418 Other specified anxiety disorders: Secondary | ICD-10-CM | POA: Diagnosis not present

## 2022-05-11 DIAGNOSIS — M4726 Other spondylosis with radiculopathy, lumbar region: Secondary | ICD-10-CM | POA: Diagnosis not present

## 2022-05-11 DIAGNOSIS — Z0000 Encounter for general adult medical examination without abnormal findings: Secondary | ICD-10-CM | POA: Diagnosis not present

## 2022-05-11 DIAGNOSIS — J441 Chronic obstructive pulmonary disease with (acute) exacerbation: Secondary | ICD-10-CM | POA: Diagnosis not present

## 2022-05-11 DIAGNOSIS — I739 Peripheral vascular disease, unspecified: Secondary | ICD-10-CM | POA: Diagnosis not present

## 2022-05-11 DIAGNOSIS — I10 Essential (primary) hypertension: Secondary | ICD-10-CM | POA: Diagnosis not present

## 2022-05-11 DIAGNOSIS — Z1331 Encounter for screening for depression: Secondary | ICD-10-CM | POA: Diagnosis not present

## 2022-05-11 DIAGNOSIS — K219 Gastro-esophageal reflux disease without esophagitis: Secondary | ICD-10-CM | POA: Diagnosis not present

## 2022-05-11 DIAGNOSIS — R54 Age-related physical debility: Secondary | ICD-10-CM | POA: Diagnosis not present

## 2022-05-11 DIAGNOSIS — R636 Underweight: Secondary | ICD-10-CM | POA: Diagnosis not present

## 2022-05-11 DIAGNOSIS — J449 Chronic obstructive pulmonary disease, unspecified: Secondary | ICD-10-CM | POA: Diagnosis not present

## 2022-05-11 DIAGNOSIS — F418 Other specified anxiety disorders: Secondary | ICD-10-CM | POA: Diagnosis not present

## 2022-05-11 DIAGNOSIS — I6523 Occlusion and stenosis of bilateral carotid arteries: Secondary | ICD-10-CM | POA: Diagnosis not present

## 2022-05-11 DIAGNOSIS — R0609 Other forms of dyspnea: Secondary | ICD-10-CM | POA: Diagnosis not present

## 2022-06-13 ENCOUNTER — Other Ambulatory Visit (HOSPITAL_COMMUNITY): Payer: Self-pay | Admitting: Internal Medicine

## 2022-06-13 DIAGNOSIS — R0609 Other forms of dyspnea: Secondary | ICD-10-CM

## 2022-06-20 ENCOUNTER — Other Ambulatory Visit: Payer: Self-pay

## 2022-06-20 DIAGNOSIS — R0989 Other specified symptoms and signs involving the circulatory and respiratory systems: Secondary | ICD-10-CM

## 2022-06-27 ENCOUNTER — Ambulatory Visit (HOSPITAL_COMMUNITY): Payer: Medicare Other | Attending: Cardiology

## 2022-06-27 DIAGNOSIS — R0609 Other forms of dyspnea: Secondary | ICD-10-CM | POA: Diagnosis not present

## 2022-06-27 LAB — ECHOCARDIOGRAM COMPLETE
Area-P 1/2: 4.52 cm2
S' Lateral: 2.2 cm

## 2022-07-11 ENCOUNTER — Ambulatory Visit (HOSPITAL_COMMUNITY)
Admission: RE | Admit: 2022-07-11 | Discharge: 2022-07-11 | Disposition: A | Discharge status: Home / Self Care | Payer: Medicare Other | Source: Ambulatory Visit | Attending: Cardiology | Admitting: Cardiology

## 2022-07-11 DIAGNOSIS — R0989 Other specified symptoms and signs involving the circulatory and respiratory systems: Secondary | ICD-10-CM

## 2022-07-27 DIAGNOSIS — M5451 Vertebrogenic low back pain: Secondary | ICD-10-CM | POA: Diagnosis not present

## 2022-07-27 DIAGNOSIS — M542 Cervicalgia: Secondary | ICD-10-CM | POA: Diagnosis not present

## 2022-08-03 ENCOUNTER — Ambulatory Visit: Payer: Medicare Other | Attending: Cardiovascular Disease | Admitting: Cardiovascular Disease

## 2022-08-03 ENCOUNTER — Encounter: Payer: Self-pay | Admitting: Cardiovascular Disease

## 2022-08-03 VITALS — BP 146/74 | HR 75 | Ht 64.0 in | Wt 105.0 lb

## 2022-08-03 DIAGNOSIS — I10 Essential (primary) hypertension: Secondary | ICD-10-CM | POA: Insufficient documentation

## 2022-08-03 DIAGNOSIS — R0989 Other specified symptoms and signs involving the circulatory and respiratory systems: Secondary | ICD-10-CM | POA: Insufficient documentation

## 2022-08-03 DIAGNOSIS — I739 Peripheral vascular disease, unspecified: Secondary | ICD-10-CM | POA: Diagnosis not present

## 2022-08-03 NOTE — Progress Notes (Signed)
08/03/2022 Kim Higgins   11-18-1945  [id number]  Primary Physician Leeroy Cha, MD Primary Cardiologist: Lorretta Harp MD [facility] Reddish, [place], [place]  HPI:  Kim Higgins is a 76 y.o.  who I last saw in the office 07/03/2021. She was remotely a patient of Dr. Ky Barban. 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 She has a history of hypertension, discontinued tobacco abuse and mild left subclavian artery stenosis. She denies chest pain but does get shortness of breath from COPD improved on inhaled bronchodilators. Since I saw her year ago she denies chest pain or shortness of breath.    When I saw her last year, she was complaining of new onset episodic dizziness without syncope.  Recent Holter monitor did not show any arrhythmias that would be implicated in this except for occasional PVCs and a short atrial run.  She did not have any episodes while wearing the Holter monitor.  She does have left subclavian artery stenosis which could be causing the symptoms as well.   Since I saw her a year ago she has remained stable.  She still gets occasionally dizzy but this is not frequent.  She denies chest pain or shortness of breath.  She did have carotid Dopplers performed 07/03/2022 revealing no evidence of ICA stenosis with symmetric blood pressures and antegrade vertebral blood flow bilaterally.  A 2D echo performed 06/27/2022 was entirely normal as well.   Current Meds  Medication Sig   albuterol-ipratropium (COMBIVENT) 18-103 MCG/ACT inhaler Inhale 1 puff into the lungs as needed for wheezing or shortness of breath.   ASCORBIC ACID PO Take 350 mg by mouth daily.   b complex vitamins tablet Take 1 tablet by mouth daily.   buPROPion (WELLBUTRIN SR) 150 MG 12 hr tablet Take 300 mg by mouth daily.    Cholecalciferol (VITAMIN D-3) 1000 UNITS CAPS Take 1 capsule by mouth daily.    cyclobenzaprine (FLEXERIL) 10 MG tablet Take 10 mg by mouth 2 (two) times daily as needed.   estradiol (ESTRACE) 2 MG tablet Take 2 mg by mouth daily.   famotidine (PEPCID) 20 MG tablet Take 20 mg by mouth at bedtime.   fluticasone-salmeterol (ADVAIR) 250-50 MCG/ACT AEPB Inhale into the lungs.   irbesartan (AVAPRO) 150 MG tablet TAKE 1 TABLET(150 MG) BY MOUTH DAILY   Omega-3 Fatty Acids (FISH OIL) 1200 MG CAPS Take 1 capsule by mouth daily.   progesterone (PROMETRIUM) 100 MG capsule Take 1 capsule by mouth once. Once monthly     Allergies  Allergen Reactions   Nystatin Diarrhea    Social History   Socioeconomic History   Marital status: Single    Spouse name: Not on file   Number of children: Not on file   Years of education: Not on file   Highest education level: Not on file  Occupational History   Not on file  Tobacco Use   Smoking status: Former    Packs/day: 0.00    Years: 30.00    Total pack years: 0.00    Types: Cigarettes    Quit date: 08/14/2014    Years since quitting: 7.9   Smokeless tobacco: Never  Substance and Sexual Activity   Alcohol use: Yes    Alcohol/week: 4.0 standard drinks of alcohol    Types: 4 Standard drinks or equivalent per week   Drug use: Not on file   Sexual  activity: Not on file  Other Topics Concern   Not on file  Social History Narrative   Not on file   Social Determinants of Health   Financial Resource Strain: Not on file  Food Insecurity: Not on file  Transportation Needs: Not on file  Physical Activity: Not on file  Stress: Not on file  Social Connections: Not on file  Intimate Partner Violence: Not on file     Review of Systems: General: negative for chills, fever, night sweats or weight changes.  Cardiovascular: negative for chest pain, dyspnea on exertion, edema, orthopnea, palpitations, paroxysmal nocturnal dyspnea or shortness of breath Dermatological: negative for rash Respiratory: negative for cough or  wheezing Urologic: negative for hematuria Abdominal: negative for nausea, vomiting, diarrhea, bright red blood per rectum, melena, or hematemesis Neurologic: negative for visual changes, syncope, or dizziness All other systems reviewed and are otherwise negative except as noted above.    Blood pressure (!) 146/74, pulse 75, height '5\' 4"'$  (1.626 m), weight 105 lb (47.6 kg).  General appearance: alert and no distress Neck: no adenopathy, no JVD, supple, symmetrical, trachea midline, thyroid not enlarged, symmetric, no tenderness/mass/nodules, and soft left carotid/left subclavian bruit. Lungs: clear to auscultation bilaterally Heart: regular rate and rhythm, S1, S2 normal, no murmur, click, rub or gallop Extremities: extremities normal, atraumatic, no cyanosis or edema Pulses: 2+ and symmetric Skin: Skin color, texture, turgor normal. No rashes or lesions Neurologic: Grossly normal  EKG sinus rhythm at 75 without ST or T wave changes.  There was low limb voltage and poor R wave progression.  I personally reviewed this EKG.  ASSESSMENT AND PLAN:   HTN (hypertension) History of essential hypertension blood pressure measured today of 146/74.  She is on Avapro.  PVD (peripheral vascular disease)- 50% LSCA stenosis by doppler 2012, History of left subclavian stenosis in the past although recent carotid Doppler studies performed 07/03/2022 revealed no evidence of ICA stenosis with equal blood pressures bilaterally and antegrade vertebral blood flow.  She does have a soft supraclavicular bruit on the left.     Lorretta Harp MD FACP,FACC,FAHA, [facility] 08/03/2022 3:43 PM

## 2022-08-03 NOTE — Assessment & Plan Note (Signed)
History of essential hypertension blood pressure measured today of 146/74.  She is on Avapro.

## 2022-08-03 NOTE — Patient Instructions (Signed)
Medication Instructions:  Your physician recommends that you continue on your current medications as directed. Please refer to the Current Medication list given to you today.  *If you need a refill on your cardiac medications before your next appointment, please call your pharmacy*   Follow-Up: At [facility], you and your health needs are our priority.  As part of our continuing mission to provide you with exceptional heart care, we have created designated Provider Care Teams.  These Care Teams include your primary Cardiologist (physician) and Advanced Practice Providers (APPs -  Physician Assistants and Nurse Practitioners) who all work together to provide you with the care you need, when you need it.  We recommend signing up for the patient portal called "MyChart".  Sign up information is provided on this After Visit Summary.  MyChart is used to connect with patients for Virtual Visits (Telemedicine).  Patients are able to view lab/test results, encounter notes, upcoming appointments, etc.  Non-urgent messages can be sent to your provider as well.   To learn more about what you can do with MyChart, go to [email or web address].    Your next appointment:   12 month(s)  The format for your next appointment:   In Person  Provider:   Jonathan Berry, MD   

## 2022-08-03 NOTE — Assessment & Plan Note (Signed)
History of left subclavian stenosis in the past although recent carotid Doppler studies performed 07/03/2022 revealed no evidence of ICA stenosis with equal blood pressures bilaterally and antegrade vertebral blood flow.  She does have a soft supraclavicular bruit on the left.

## 2022-08-10 ENCOUNTER — Other Ambulatory Visit (HOSPITAL_COMMUNITY): Payer: Self-pay | Admitting: Cardiovascular Disease

## 2022-08-10 DIAGNOSIS — I779 Disorder of arteries and arterioles, unspecified: Secondary | ICD-10-CM

## 2022-08-15 DIAGNOSIS — M542 Cervicalgia: Secondary | ICD-10-CM | POA: Diagnosis not present

## 2022-08-15 DIAGNOSIS — M5451 Vertebrogenic low back pain: Secondary | ICD-10-CM | POA: Diagnosis not present

## 2022-08-24 DIAGNOSIS — M542 Cervicalgia: Secondary | ICD-10-CM | POA: Diagnosis not present

## 2022-08-24 DIAGNOSIS — M5451 Vertebrogenic low back pain: Secondary | ICD-10-CM | POA: Diagnosis not present

## 2022-08-29 DIAGNOSIS — Z23 Encounter for immunization: Secondary | ICD-10-CM | POA: Diagnosis not present

## 2022-08-30 DIAGNOSIS — T148XXA Other injury of unspecified body region, initial encounter: Secondary | ICD-10-CM | POA: Diagnosis not present

## 2022-08-30 DIAGNOSIS — W19XXXA Unspecified fall, initial encounter: Secondary | ICD-10-CM | POA: Diagnosis not present

## 2022-09-02 ENCOUNTER — Emergency Department (HOSPITAL_COMMUNITY)
Admission: EM | Admit: 2022-09-02 | Discharge: 2022-09-03 | Disposition: A | Discharge status: Home / Self Care | Payer: Medicare Other | Attending: Emergency Medicine | Admitting: Emergency Medicine

## 2022-09-02 ENCOUNTER — Other Ambulatory Visit: Payer: Self-pay

## 2022-09-02 DIAGNOSIS — S8991XA Unspecified injury of right lower leg, initial encounter: Secondary | ICD-10-CM | POA: Diagnosis present

## 2022-09-02 DIAGNOSIS — W5503XA Scratched by cat, initial encounter: Secondary | ICD-10-CM | POA: Diagnosis not present

## 2022-09-02 DIAGNOSIS — S81811A Laceration without foreign body, right lower leg, initial encounter: Secondary | ICD-10-CM | POA: Insufficient documentation

## 2022-09-02 NOTE — ED Triage Notes (Signed)
Came in from home for Rt leg tear, states cat scratched leg all vaccines are UTD, no blood thinners, last tetanus 2018

## 2022-09-03 DIAGNOSIS — S81811A Laceration without foreign body, right lower leg, initial encounter: Secondary | ICD-10-CM | POA: Diagnosis not present

## 2022-09-03 MED ORDER — AMOXICILLIN-POT CLAVULANATE 875-125 MG PO TABS
1.0000 | ORAL_TABLET | Freq: Once | ORAL | Status: AC
  Administered 2022-09-03: 1 via ORAL
  Filled 2022-09-03: qty 1

## 2022-09-03 MED ORDER — LIDOCAINE-EPINEPHRINE (PF) 2 %-1:200000 IJ SOLN
10.0000 mL | Freq: Once | INTRAMUSCULAR | Status: AC
  Administered 2022-09-03: 10 mL
  Filled 2022-09-03: qty 20

## 2022-09-03 MED ORDER — LIDOCAINE-EPINEPHRINE-TETRACAINE (LET) TOPICAL GEL
3.0000 mL | Freq: Once | TOPICAL | Status: AC
  Administered 2022-09-03: 3 mL via TOPICAL
  Filled 2022-09-03: qty 3

## 2022-09-03 MED ORDER — AMOXICILLIN-POT CLAVULANATE 875-125 MG PO TABS: 1.0000 | ORAL_TABLET | Freq: Two times a day (BID) | ORAL | 0 refills | Status: DC

## 2022-09-03 MED ORDER — ACETAMINOPHEN 325 MG PO TABS
650.0000 mg | ORAL_TABLET | Freq: Once | ORAL | Status: AC
  Administered 2022-09-03: 650 mg via ORAL
  Filled 2022-09-03: qty 2

## 2022-09-03 NOTE — Discharge Instructions (Addendum)
You were seen in the emergency department today for a laceration. Your laceration was closed with 6 stitches. Please keep this area clean and dry for the next 24 hours, after 24 hours you may get this area wet, but avoid soaking the area. Keep the area covered as best possible especially when in the sun to help in minimizing scarring.   We closed the wound loosely due it being cat related, we are also putting you on augmentin for this reason- please take the antibiotic as prescribed.  We have prescribed you new medication(s) today. Discuss the medications prescribed today with your pharmacist as they can have adverse effects and interactions with your other medicines including over the counter and prescribed medications. Seek medical evaluation if you start to experience new or abnormal symptoms after taking one of these medicines, seek care immediately if you start to experience difficulty breathing, feeling of your throat closing, facial swelling, or rash as these could be indications of a more serious allergic reaction  Please have the wound rechecked in  3 days, the stitches will need to be removed in  7-10 days. Please return to the emergency department, go to an urgent care, or see your primary care provider for each of these visits. Return to the ER soon should you start to experience pus type drainage from the wound, redness around the wound, or fevers as this could indicate the area is infected, please return to the ER for any other worsening symptoms or concerns that you may have.

## 2022-09-03 NOTE — ED Notes (Signed)
6 stitches placed in right lower extremity per EDP.   Pt instructed to return to a healthcare facility for suture removal in 7-10 days and encouraged to see PCP in 3 days for wound reeval.   Rx re-sent to preferred pharmacy. Pt verbalizes understanding and denies further questions. Ordered an Surveyor, mining and ambulatory out of department.

## 2022-09-03 NOTE — ED Provider Notes (Signed)
[facility] DEPT Provider Note   CSN: [id number] Arrival date & time: 09/02/22  2108     History  Chief Complaint  Patient presents with   Laceration    Kim Higgins is a 76 y.o. female who presents to the ED with complaints of cat scratch injury to her right lower leg that occurred shortly PTA. Patient reports her cat was startled while on her lab and scratched her lower leg resulting in laceration. The area is sore. No alleviating/aggravating factors. She reports she had a tetanus in 2018. Cat is up to date on rabies vaccine. She denies fever or chills.   HPI     Home Medications Prior to Admission medications   Medication Sig Start Date End Date Taking? Authorizing Provider  albuterol-ipratropium (COMBIVENT) 18-103 MCG/ACT inhaler Inhale 1 puff into the lungs as needed for wheezing or shortness of breath.    [provider]  ASCORBIC ACID PO Take 350 mg by mouth daily.    [provider]  b complex vitamins tablet Take 1 tablet by mouth daily.    [provider]  buPROPion (WELLBUTRIN SR) 150 MG 12 hr tablet Take 300 mg by mouth daily.     [provider]  Cholecalciferol (VITAMIN D-3) 1000 UNITS CAPS Take 1 capsule by mouth daily.    [provider]  cyclobenzaprine (FLEXERIL) 10 MG tablet Take 10 mg by mouth 2 (two) times daily as needed. 05/07/21   [provider]  estradiol (ESTRACE) 2 MG tablet Take 2 mg by mouth daily.    [provider]  famotidine (PEPCID) 20 MG tablet Take 20 mg by mouth at bedtime.    [provider]  fluticasone-salmeterol (ADVAIR) 250-50 MCG/ACT AEPB Inhale into the lungs.    [provider]  irbesartan (AVAPRO) 150 MG tablet TAKE 1 TABLET(150 MG) BY MOUTH DAILY 07/13/20   Lorretta Harp, MD  Omega-3 Fatty Acids (FISH OIL) 1200 MG CAPS Take 1 capsule by mouth daily.    [provider]  progesterone (PROMETRIUM) 100 MG capsule  Take 1 capsule by mouth once. Once monthly    [provider]      Allergies    Nystatin    Review of Systems   Review of Systems  Constitutional:  Negative for chills and fever.  Respiratory:  Negative for shortness of breath.   Cardiovascular:  Negative for chest pain.  Gastrointestinal:  Negative for abdominal pain.  Skin:  Positive for wound.  All other systems reviewed and are negative.   Physical Exam Updated Vital Signs BP (!) 157/84 (BP Location: Left Arm)   Pulse 94   Temp 97.6 F (36.4 C) (Oral)   Resp 20   Ht '5\' 3"'$  (1.6 m)   Wt 51.7 kg   SpO2 95%   BMI 20.19 kg/m  Physical Exam Vitals and nursing note reviewed.  Constitutional:      General: She is not in acute distress.    Appearance: She is well-developed.  HENT:     Head: Normocephalic and atraumatic.  Eyes:     General:        Right eye: No discharge.        Left eye: No discharge.     Conjunctiva/sclera: Conjunctivae normal.  Cardiovascular:     Rate and Rhythm: Normal rate.     Comments: 2+ PT pulses Pulmonary:     Effort: No respiratory distress.  Abdominal:     General: There  is no distension.  Musculoskeletal:     Cervical back: Neck supple.     Comments: Patient has a V shaped laceration to the right anterior lower leg, 4-5 mm deep, 7 cm length in total (4 cm in one direction 3 cm in the other), SQ tissue exposure, pictured below, thin skin flap. No active bleeding or visible Fbs. Also has steristrips over bilateral anterior knees from prior injury no surrounding erythema/warmth/purluence. Ranging throughout. Mildly tender over wound.   Skin:    General: Skin is warm and dry.  Neurological:     Mental Status: She is alert.     Comments: Sensation grossly intact to lower extremities. Ambulatory. Clear speech.   Psychiatric:        Behavior: Behavior normal.        Thought Content: Thought content normal.       ED Results / Procedures / Treatments   Labs (all labs ordered  are listed, but only abnormal results are displayed) Labs Reviewed - No data to display  EKG None  Radiology No results found.  Procedures .Marland KitchenLaceration Repair  Date/Time: 09/03/2022 4:30 AM  Performed by: Amaryllis Dyke, PA-C Authorized by: Amaryllis Dyke, PA-C   Consent:    Consent obtained:  Verbal   Consent given by:  Patient   Risks, benefits, and alternatives were discussed: yes     Risks discussed:  Nerve damage, poor wound healing, poor cosmetic result, pain, tendon damage, vascular damage, retained foreign body, infection and need for additional repair   Alternatives discussed:  No treatment Anesthesia:    Anesthesia method:  Topical application and local infiltration   Topical anesthetic:  LET   Local anesthetic:  Lidocaine 2% WITH epi Laceration details:    Location:  Leg   Leg location:  R lower leg   Wound length (cm): total 7 cm.   Depth (mm):  4 Pre-procedure details:    Preparation:  Patient was prepped and draped in usual sterile fashion Exploration:    Hemostasis achieved with:  Direct pressure   Contaminated: no   Treatment:    Area cleansed with:  Povidone-iodine   Amount of cleaning:  Extensive   Irrigation solution:  Sterile water   Irrigation method:  Pressure wash Skin repair:    Repair method:  Sutures   Suture size:  4-0   Suture material:  Nylon   Suture technique:  Simple interrupted   Number of sutures:  6 Approximation:    Approximation:  Loose Repair type:    Repair type:  Simple Post-procedure details:    Procedure completion:  Tolerated well, no immediate complications     Medications Ordered in ED Medications  lidocaine-EPINEPHrine-tetracaine (LET) topical gel (3 mLs Topical Given 09/03/22 0319)  lidocaine-EPINEPHrine (XYLOCAINE W/EPI) 2 %-1:200000 (PF) injection 10 mL (10 mLs Infiltration Given 09/03/22 0319)  amoxicillin-clavulanate (AUGMENTIN) 875-125 MG per tablet 1 tablet (1 tablet Oral Given 09/03/22  0451)  acetaminophen (TYLENOL) tablet 650 mg (650 mg Oral Given 09/03/22 0450)    ED Course/ Medical Decision Making/ A&P                           Medical Decision Making Risk OTC drugs. Prescription drug management.   Patient presents to the ED with cat scratch injury.  Nontoxic, vitals w/ mildly elevated BP- doubt HTN emergency.   Chart/nursing notes reviewed for additional hx.   Wound pressure irrigated & visualized in bloodless field without  evidence of FB. Based on mechanism low suspicion for underlying fx. Discussed laceration with attending- recommends loose closure after irrigation w/ initiation of abx which I am in agreement. Additional irrigation performed. Procedure per note above, tolerated well, nvi distally prior to and following laceration repair. Augmentin initiated in the ED- advised close wound recheck within 72 hours with very strict return precautions. Discussed wound care.  I discussed treatment plan, need for follow-up, and return precautions with the patient. Provided opportunity for questions, patient confirmed understanding and is in agreement with plan.   Findings and plan of care discussed with supervising physician Dr. Leonette Monarch who is in agreement.   Final Clinical Impression(s) / ED Diagnoses Final diagnoses:  Laceration of right lower extremity, initial encounter    Rx / DC Orders ED Discharge Orders          Ordered    amoxicillin-clavulanate (AUGMENTIN) 875-125 MG tablet  Every 12 hours,   Status:  Discontinued        09/03/22 0437    amoxicillin-clavulanate (AUGMENTIN) 875-125 MG tablet  Every 12 hours        09/03/22 0501              Axelle Szwed, Glynda Jaeger, PA-C 09/03/22 2458    Fatima Blank, MD 09/03/22 [phone]

## 2022-09-03 NOTE — ED Notes (Signed)
Nonadherent bandage placed and supplies to care for wound provided.

## 2022-09-05 DIAGNOSIS — M4726 Other spondylosis with radiculopathy, lumbar region: Secondary | ICD-10-CM | POA: Diagnosis not present

## 2022-09-05 DIAGNOSIS — S80219A Abrasion, unspecified knee, initial encounter: Secondary | ICD-10-CM | POA: Diagnosis not present

## 2022-09-05 DIAGNOSIS — W19XXXA Unspecified fall, initial encounter: Secondary | ICD-10-CM | POA: Diagnosis not present

## 2022-09-05 DIAGNOSIS — S81811D Laceration without foreign body, right lower leg, subsequent encounter: Secondary | ICD-10-CM | POA: Diagnosis not present

## 2022-09-10 DIAGNOSIS — S81811A Laceration without foreign body, right lower leg, initial encounter: Secondary | ICD-10-CM | POA: Diagnosis not present

## 2022-09-10 DIAGNOSIS — W5503XA Scratched by cat, initial encounter: Secondary | ICD-10-CM | POA: Diagnosis not present

## 2022-09-13 DIAGNOSIS — T798XXA Other early complications of trauma, initial encounter: Secondary | ICD-10-CM | POA: Diagnosis not present

## 2022-09-13 DIAGNOSIS — L0390 Cellulitis, unspecified: Secondary | ICD-10-CM | POA: Diagnosis not present

## 2022-09-13 DIAGNOSIS — I739 Peripheral vascular disease, unspecified: Secondary | ICD-10-CM | POA: Diagnosis not present

## 2022-09-14 DIAGNOSIS — H5201 Hypermetropia, right eye: Secondary | ICD-10-CM | POA: Diagnosis not present

## 2022-09-14 DIAGNOSIS — H04123 Dry eye syndrome of bilateral lacrimal glands: Secondary | ICD-10-CM | POA: Diagnosis not present

## 2022-09-14 DIAGNOSIS — H353122 Nonexudative age-related macular degeneration, left eye, intermediate dry stage: Secondary | ICD-10-CM | POA: Diagnosis not present

## 2022-09-14 DIAGNOSIS — Z961 Presence of intraocular lens: Secondary | ICD-10-CM | POA: Diagnosis not present

## 2022-09-14 DIAGNOSIS — H5212 Myopia, left eye: Secondary | ICD-10-CM | POA: Diagnosis not present

## 2022-09-19 DIAGNOSIS — S81802A Unspecified open wound, left lower leg, initial encounter: Secondary | ICD-10-CM | POA: Diagnosis not present

## 2022-09-19 DIAGNOSIS — I739 Peripheral vascular disease, unspecified: Secondary | ICD-10-CM | POA: Diagnosis not present

## 2022-09-29 ENCOUNTER — Other Ambulatory Visit: Payer: Self-pay | Admitting: Internal Medicine

## 2022-09-29 DIAGNOSIS — I739 Peripheral vascular disease, unspecified: Secondary | ICD-10-CM

## 2022-10-03 DIAGNOSIS — K219 Gastro-esophageal reflux disease without esophagitis: Secondary | ICD-10-CM | POA: Diagnosis not present

## 2022-10-03 DIAGNOSIS — F3341 Major depressive disorder, recurrent, in partial remission: Secondary | ICD-10-CM | POA: Diagnosis not present

## 2022-10-03 DIAGNOSIS — J449 Chronic obstructive pulmonary disease, unspecified: Secondary | ICD-10-CM | POA: Diagnosis not present

## 2022-10-03 DIAGNOSIS — I10 Essential (primary) hypertension: Secondary | ICD-10-CM | POA: Diagnosis not present

## 2022-10-11 DIAGNOSIS — J449 Chronic obstructive pulmonary disease, unspecified: Secondary | ICD-10-CM | POA: Diagnosis present

## 2022-10-11 DIAGNOSIS — R29890 Loss of height: Secondary | ICD-10-CM | POA: Diagnosis not present

## 2022-10-11 DIAGNOSIS — Z681 Body mass index (BMI) 19 or less, adult: Secondary | ICD-10-CM | POA: Diagnosis not present

## 2022-10-11 DIAGNOSIS — Z01419 Encounter for gynecological examination (general) (routine) without abnormal findings: Secondary | ICD-10-CM | POA: Diagnosis not present

## 2022-10-11 DIAGNOSIS — N958 Other specified menopausal and perimenopausal disorders: Secondary | ICD-10-CM | POA: Diagnosis not present

## 2022-10-11 DIAGNOSIS — Z1231 Encounter for screening mammogram for malignant neoplasm of breast: Secondary | ICD-10-CM | POA: Diagnosis not present

## 2022-10-11 DIAGNOSIS — N952 Postmenopausal atrophic vaginitis: Secondary | ICD-10-CM | POA: Diagnosis not present

## 2022-10-11 DIAGNOSIS — Z13820 Encounter for screening for osteoporosis: Secondary | ICD-10-CM | POA: Diagnosis not present

## 2022-10-11 DIAGNOSIS — Z8262 Family history of osteoporosis: Secondary | ICD-10-CM | POA: Diagnosis not present

## 2022-10-12 ENCOUNTER — Ambulatory Visit
Admission: RE | Admit: 2022-10-12 | Discharge: 2022-10-12 | Disposition: A | Discharge status: Home / Self Care | Payer: Medicare Other | Source: Ambulatory Visit | Attending: Internal Medicine | Admitting: Internal Medicine

## 2022-10-12 ENCOUNTER — Other Ambulatory Visit: Payer: Self-pay | Admitting: Internal Medicine

## 2022-10-12 DIAGNOSIS — I739 Peripheral vascular disease, unspecified: Secondary | ICD-10-CM

## 2022-10-12 DIAGNOSIS — I10 Essential (primary) hypertension: Secondary | ICD-10-CM | POA: Diagnosis not present

## 2022-10-26 DIAGNOSIS — J449 Chronic obstructive pulmonary disease, unspecified: Secondary | ICD-10-CM | POA: Diagnosis not present

## 2022-10-26 DIAGNOSIS — I10 Essential (primary) hypertension: Secondary | ICD-10-CM | POA: Diagnosis not present

## 2022-10-26 DIAGNOSIS — I6523 Occlusion and stenosis of bilateral carotid arteries: Secondary | ICD-10-CM | POA: Diagnosis not present

## 2022-10-26 DIAGNOSIS — E041 Nontoxic single thyroid nodule: Secondary | ICD-10-CM | POA: Diagnosis not present

## 2022-10-31 DIAGNOSIS — I10 Essential (primary) hypertension: Secondary | ICD-10-CM | POA: Diagnosis not present

## 2022-10-31 DIAGNOSIS — K219 Gastro-esophageal reflux disease without esophagitis: Secondary | ICD-10-CM | POA: Diagnosis not present

## 2022-10-31 DIAGNOSIS — F3341 Major depressive disorder, recurrent, in partial remission: Secondary | ICD-10-CM | POA: Diagnosis not present

## 2022-10-31 DIAGNOSIS — J449 Chronic obstructive pulmonary disease, unspecified: Secondary | ICD-10-CM | POA: Diagnosis not present

## 2022-11-18 DIAGNOSIS — I83029 Varicose veins of left lower extremity with ulcer of unspecified site: Secondary | ICD-10-CM | POA: Diagnosis not present

## 2022-11-18 DIAGNOSIS — L0390 Cellulitis, unspecified: Secondary | ICD-10-CM | POA: Diagnosis not present

## 2022-11-18 DIAGNOSIS — E46 Unspecified protein-calorie malnutrition: Secondary | ICD-10-CM | POA: Diagnosis not present

## 2022-11-18 DIAGNOSIS — R600 Localized edema: Secondary | ICD-10-CM | POA: Diagnosis not present

## 2023-01-28 ENCOUNTER — Emergency Department (HOSPITAL_BASED_OUTPATIENT_CLINIC_OR_DEPARTMENT_OTHER): Payer: Medicare Other | Admitting: Radiology

## 2023-01-28 ENCOUNTER — Observation Stay (HOSPITAL_BASED_OUTPATIENT_CLINIC_OR_DEPARTMENT_OTHER)
Admission: EM | Admit: 2023-01-28 | Discharge: 2023-01-30 | Disposition: A | Discharge status: Home / Self Care | Payer: Medicare Other | Attending: Internal Medicine | Admitting: Internal Medicine

## 2023-01-28 ENCOUNTER — Emergency Department (HOSPITAL_BASED_OUTPATIENT_CLINIC_OR_DEPARTMENT_OTHER): Payer: Medicare Other

## 2023-01-28 ENCOUNTER — Encounter (HOSPITAL_COMMUNITY): Payer: Self-pay

## 2023-01-28 ENCOUNTER — Other Ambulatory Visit: Payer: Self-pay

## 2023-01-28 DIAGNOSIS — Z79899 Other long term (current) drug therapy: Secondary | ICD-10-CM | POA: Insufficient documentation

## 2023-01-28 DIAGNOSIS — S02601A Fracture of unspecified part of body of right mandible, initial encounter for closed fracture: Principal | ICD-10-CM | POA: Insufficient documentation

## 2023-01-28 DIAGNOSIS — I10 Essential (primary) hypertension: Secondary | ICD-10-CM | POA: Insufficient documentation

## 2023-01-28 DIAGNOSIS — M1811 Unilateral primary osteoarthritis of first carpometacarpal joint, right hand: Secondary | ICD-10-CM | POA: Diagnosis not present

## 2023-01-28 DIAGNOSIS — M25561 Pain in right knee: Secondary | ICD-10-CM | POA: Diagnosis not present

## 2023-01-28 DIAGNOSIS — J449 Chronic obstructive pulmonary disease, unspecified: Secondary | ICD-10-CM | POA: Diagnosis not present

## 2023-01-28 DIAGNOSIS — S02611B Fracture of condylar process of right mandible, initial encounter for open fracture: Secondary | ICD-10-CM | POA: Diagnosis not present

## 2023-01-28 DIAGNOSIS — S02609A Fracture of mandible, unspecified, initial encounter for closed fracture: Secondary | ICD-10-CM | POA: Diagnosis not present

## 2023-01-28 DIAGNOSIS — I739 Peripheral vascular disease, unspecified: Secondary | ICD-10-CM | POA: Diagnosis present

## 2023-01-28 DIAGNOSIS — S02611A Fracture of condylar process of right mandible, initial encounter for closed fracture: Secondary | ICD-10-CM

## 2023-01-28 DIAGNOSIS — J9601 Acute respiratory failure with hypoxia: Secondary | ICD-10-CM | POA: Diagnosis not present

## 2023-01-28 DIAGNOSIS — W010XXA Fall on same level from slipping, tripping and stumbling without subsequent striking against object, initial encounter: Secondary | ICD-10-CM | POA: Diagnosis not present

## 2023-01-28 DIAGNOSIS — Z23 Encounter for immunization: Secondary | ICD-10-CM | POA: Diagnosis not present

## 2023-01-28 DIAGNOSIS — R9082 White matter disease, unspecified: Secondary | ICD-10-CM | POA: Diagnosis not present

## 2023-01-28 DIAGNOSIS — Y9301 Activity, walking, marching and hiking: Secondary | ICD-10-CM | POA: Diagnosis not present

## 2023-01-28 DIAGNOSIS — T07XXXA Unspecified multiple injuries, initial encounter: Secondary | ICD-10-CM | POA: Insufficient documentation

## 2023-01-28 DIAGNOSIS — S60511A Abrasion of right hand, initial encounter: Secondary | ICD-10-CM | POA: Diagnosis not present

## 2023-01-28 DIAGNOSIS — M25562 Pain in left knee: Secondary | ICD-10-CM | POA: Diagnosis not present

## 2023-01-28 DIAGNOSIS — W19XXXA Unspecified fall, initial encounter: Secondary | ICD-10-CM | POA: Diagnosis not present

## 2023-01-28 DIAGNOSIS — Z87891 Personal history of nicotine dependence: Secondary | ICD-10-CM | POA: Diagnosis not present

## 2023-01-28 DIAGNOSIS — M1812 Unilateral primary osteoarthritis of first carpometacarpal joint, left hand: Secondary | ICD-10-CM | POA: Diagnosis not present

## 2023-01-28 DIAGNOSIS — M19032 Primary osteoarthritis, left wrist: Secondary | ICD-10-CM | POA: Diagnosis not present

## 2023-01-28 DIAGNOSIS — S02601B Fracture of unspecified part of body of right mandible, initial encounter for open fracture: Secondary | ICD-10-CM

## 2023-01-28 DIAGNOSIS — S81011A Laceration without foreign body, right knee, initial encounter: Secondary | ICD-10-CM | POA: Diagnosis not present

## 2023-01-28 DIAGNOSIS — Y92009 Unspecified place in unspecified non-institutional (private) residence as the place of occurrence of the external cause: Secondary | ICD-10-CM

## 2023-01-28 DIAGNOSIS — R519 Headache, unspecified: Secondary | ICD-10-CM | POA: Diagnosis not present

## 2023-01-28 DIAGNOSIS — K219 Gastro-esophageal reflux disease without esophagitis: Secondary | ICD-10-CM | POA: Diagnosis present

## 2023-01-28 DIAGNOSIS — M19031 Primary osteoarthritis, right wrist: Secondary | ICD-10-CM | POA: Diagnosis not present

## 2023-01-28 DIAGNOSIS — S0181XA Laceration without foreign body of other part of head, initial encounter: Secondary | ICD-10-CM | POA: Diagnosis not present

## 2023-01-28 DIAGNOSIS — M4312 Spondylolisthesis, cervical region: Secondary | ICD-10-CM | POA: Diagnosis not present

## 2023-01-28 HISTORY — DX: Chronic obstructive pulmonary disease, unspecified: J44.9

## 2023-01-28 MED ORDER — LIDOCAINE HCL (PF) 1 % IJ SOLN
10.0000 mL | Freq: Once | INTRAMUSCULAR | Status: AC
  Administered 2023-01-28: 10 mL
  Filled 2023-01-28: qty 10

## 2023-01-28 MED ORDER — FAMOTIDINE 20 MG PO TABS
20.0000 mg | ORAL_TABLET | Freq: Once | ORAL | Status: AC
  Administered 2023-01-28: 20 mg via ORAL
  Filled 2023-01-28: qty 1

## 2023-01-28 MED ORDER — SODIUM CHLORIDE 0.9 % IV SOLN: INTRAVENOUS | Status: DC

## 2023-01-28 MED ORDER — FENTANYL CITRATE PF 50 MCG/ML IJ SOSY
50.0000 ug | PREFILLED_SYRINGE | Freq: Once | INTRAMUSCULAR | Status: AC
  Administered 2023-01-28: 50 ug via INTRAVENOUS
  Filled 2023-01-28: qty 1

## 2023-01-28 MED ORDER — LIDOCAINE-EPINEPHRINE-TETRACAINE (LET) TOPICAL GEL
3.0000 mL | Freq: Once | TOPICAL | Status: AC
  Administered 2023-01-28: 3 mL via TOPICAL
  Filled 2023-01-28: qty 3

## 2023-01-28 MED ORDER — CEFADROXIL 500 MG PO CAPS: 500.0000 mg | ORAL_CAPSULE | Freq: Two times a day (BID) | ORAL | 0 refills | Status: DC

## 2023-01-28 MED ORDER — LIDOCAINE-EPINEPHRINE 1 %-1:100000 IJ SOLN
INTRAMUSCULAR | Status: AC
  Filled 2023-01-28: qty 1

## 2023-01-28 MED ORDER — OXYMETAZOLINE HCL 0.05 % NA SOLN
NASAL | Status: AC
  Filled 2023-01-28: qty 30

## 2023-01-28 MED ORDER — CEFAZOLIN SODIUM-DEXTROSE 2-4 GM/100ML-% IV SOLN
2.0000 g | Freq: Once | INTRAVENOUS | Status: AC
  Administered 2023-01-28: 2 g via INTRAVENOUS
  Filled 2023-01-28: qty 100

## 2023-01-28 MED ORDER — TETANUS-DIPHTH-ACELL PERTUSSIS 5-2.5-18.5 LF-MCG/0.5 IM SUSY
0.5000 mL | PREFILLED_SYRINGE | Freq: Once | INTRAMUSCULAR | Status: AC
  Administered 2023-01-28: 0.5 mL via INTRAMUSCULAR
  Filled 2023-01-28: qty 0.5

## 2023-01-28 MED ORDER — MORPHINE SULFATE (PF) 4 MG/ML IV SOLN
4.0000 mg | INTRAVENOUS | Status: DC | PRN
  Administered 2023-01-29 (×2): 4 mg via INTRAVENOUS
  Filled 2023-01-28 (×3): qty 1

## 2023-01-28 MED ORDER — MORPHINE SULFATE (PF) 4 MG/ML IV SOLN
4.0000 mg | Freq: Once | INTRAVENOUS | Status: AC
  Administered 2023-01-28: 4 mg via INTRAVENOUS
  Filled 2023-01-28: qty 1

## 2023-01-28 NOTE — ED Notes (Signed)
[facility] is here and she leaves with them at this time. Roselyn Reef, RN, CN at [facility] is aware of transfer with Dr. Armandina Gemma as receiving physician.

## 2023-01-28 NOTE — ED Provider Notes (Signed)
[facility] Provider Note   CSN: [id number] Arrival date & time: 01/28/23  1129     History Chief Complaint  Patient presents with   Kim Higgins is a 77 y.o. female with history of hypertension presents the emergency room today for evaluation of multiple lacerations and injuries after fall.  Patient reports that she was walking and tripped over her shoe and landed on her knees and hands and hitting her chin on the asphalt.  She denies any loss of consciousness.  Denies any blood thinner use.  She denies any visual changes or numbness or tingling.  She complains of pain to her bilateral hands and her bilateral knees.  She denies any chest pain, back pain, belly pain.  Denies any neck pain.  She reports that she had one of her left molars breaking as well as the her front teeth break as well.  The patient reports that she ambulated without pain afterwards.   Fall Pertinent negatives include no chest pain, no abdominal pain, no headaches and no shortness of breath.       Home Medications Prior to Admission medications   Medication Sig Start Date End Date Taking? Authorizing Provider  cefadroxil (DURICEF) 500 MG capsule Take 1 capsule (500 mg total) by mouth 2 (two) times daily. 01/28/23  Yes Sherrell Puller, PA-C  albuterol-ipratropium (COMBIVENT) 18-103 MCG/ACT inhaler Inhale 1 puff into the lungs as needed for wheezing or shortness of breath.    [provider]  ASCORBIC ACID PO Take 350 mg by mouth daily.    [provider]  b complex vitamins tablet Take 1 tablet by mouth daily.    [provider]  buPROPion (WELLBUTRIN SR) 150 MG 12 hr tablet Take 300 mg by mouth daily.     [provider]  Cholecalciferol (VITAMIN D-3) 1000 UNITS CAPS Take 1 capsule by mouth daily.    [provider]  cyclobenzaprine (FLEXERIL) 10 MG tablet Take 10 mg by mouth 2 (two) times daily as needed. 05/07/21    [provider]  estradiol (ESTRACE) 2 MG tablet Take 2 mg by mouth daily.    [provider]  famotidine (PEPCID) 20 MG tablet Take 20 mg by mouth at bedtime.    [provider]  fluticasone-salmeterol (ADVAIR) 250-50 MCG/ACT AEPB Inhale into the lungs.    [provider]  irbesartan (AVAPRO) 150 MG tablet TAKE 1 TABLET(150 MG) BY MOUTH DAILY 07/13/20   Lorretta Harp, MD  Omega-3 Fatty Acids (FISH OIL) 1200 MG CAPS Take 1 capsule by mouth daily.    [provider]  progesterone (PROMETRIUM) 100 MG capsule Take 1 capsule by mouth once. Once monthly    [provider]      Allergies    Nystatin    Review of Systems   Review of Systems  Constitutional:  Negative for chills and fever.  HENT:  Positive for dental problem.        Reports facial and jaw pain.  Reports dental pain as well.  Eyes:  Negative for visual disturbance.  Respiratory:  Negative for shortness of breath.   Cardiovascular:  Negative for chest pain.  Gastrointestinal:  Negative for abdominal pain, constipation, diarrhea, nausea and vomiting.  Musculoskeletal:  Positive for myalgias. Negative for back pain and neck pain.  Neurological:  Negative for syncope, light-headedness and headaches.    Physical Exam Updated Vital Signs BP 124/66   Pulse  84   Temp 98.7 F (37.1 C) (Oral)   Resp 16   SpO2 96%             Physical Exam Constitutional:      Appearance: She is not toxic-appearing.  HENT:     Head:     Comments: No raccoon eyes or battle signs.  No soft or deformities of the scalp.    Right Ear: Tympanic membrane, ear canal and external ear normal.     Left Ear: Tympanic membrane, ear canal and external ear normal.     Ears:     Comments: No hemotympanums    Nose:     Comments: No septal hematoma    Mouth/Throat:     Mouth: Mucous membranes are moist.     Comments: Multiple broken teeth seen in the front lower jaw.  Blood present.  Unable  to fully assess tongue or other unremarkable mucosa due to patient's pain with opening mouth.  Approximately 3.5 cm laceration to the chin.  Subcutaneous tissue.  No visible bone or platysma.  Please see image. She is able to speak clearly.  Eyes:     Extraocular Movements: Extraocular movements intact.     Pupils: Pupils are equal, round, and reactive to light.  Neck:     Comments: Range of motion of the neck does hurt her jaw otherwise does not hurt her neck.  There is no step-offs or deformities.  No midline or paraspinal tenderness.  No signs of trauma. Cardiovascular:     Rate and Rhythm: Normal rate.  Pulmonary:     Effort: Pulmonary effort is normal. No respiratory distress.     Breath sounds: Normal breath sounds.     Comments: Equal chest rise.  Lung sounds are clear to auscultation bilaterally.  No chest wall tenderness palpation.  No signs of trauma. Chest:     Chest wall: No tenderness.  Abdominal:     Palpations: Abdomen is soft.     Tenderness: There is no abdominal tenderness. There is no guarding or rebound.     Comments: Abdomen soft and nontender.  No signs of trauma.  Musculoskeletal:     Cervical back: Normal range of motion.     Comments: Tenderness diffusely to bilateral palms with abrasions.  No lacerations seen.  No snuffbox tenderness.  Able to move her fingers and thumb.  Cap refill brisk.  Palpable radial pulses.  Skin tears noted to bilateral knees.  Some diffuse tenderness but no point tenderness.  Compartments are soft.  She is able to flex extend her ankles and lift her legs without pain.  No midline or paraspinal tenderness to the cervical, thoracic, or lumbar spine.  No step-offs or deformities.  No signs of trauma or overlying skin changes noted.  Please see attached images of patient's knee and hand abrasions.  Skin:    General: Skin is warm and dry.  Neurological:     Mental Status: She is alert.       ED Results / Procedures / Treatments    Labs (all labs ordered are listed, but only abnormal results are displayed) Labs Reviewed - No data to display  EKG None  Radiology CT Head Wo Contrast  Result Date: 01/28/2023 CLINICAL DATA:  Fall, broken teeth, concern for mandibular fracture EXAM: CT HEAD WITHOUT CONTRAST CT MAXILLOFACIAL WITHOUT CONTRAST TECHNIQUE: Multidetector CT imaging of the head and maxillofacial structures were performed using the standard protocol without intravenous contrast. Multiplanar CT image reconstructions  of the maxillofacial structures were also generated. RADIATION DOSE REDUCTION: This exam was performed according to the departmental dose-optimization program which includes automated exposure control, adjustment of the mA and/or kV according to patient size and/or use of iterative reconstruction technique. COMPARISON:  None Available. FINDINGS: CT HEAD FINDINGS Brain: No evidence of acute infarction, hemorrhage, hydrocephalus, extra-axial collection or mass lesion/mass effect. Mild periventricular white matter hypodensity. Vascular: No hyperdense vessel or unexpected calcification. CT FACIAL BONES FINDINGS Skull: Normal. Negative for fracture or focal lesion. Mandible: Comminuted, minimally displaced fractures of the mandibular body (series 4, image 15). Additional nondisplaced fractures of the right mandibular condyle and head, extending into the temporomandibular joint, without dislocation of the joint (series 4, image 40). Facial bones: No displaced fractures or dislocations of the facial bones proper. Sinuses/Orbits: No acute finding. Other: Soft tissue laceration of the chin. IMPRESSION: 1. No acute intracranial pathology. Small-vessel white matter disease. 2. Comminuted, minimally displaced fractures of the mandibular body. Additional nondisplaced fractures of the right mandibular condyle and head, extending into the temporomandibular joint, without dislocation of the joint. 3. No displaced fractures or  dislocations of the facial bones proper. 4. Soft tissue laceration of the chin. Electronically Signed   By: Delanna Ahmadi M.D.   On: 01/28/2023 16:01   CT Maxillofacial Wo Contrast  Result Date: 01/28/2023 CLINICAL DATA:  Fall, broken teeth, concern for mandibular fracture EXAM: CT HEAD WITHOUT CONTRAST CT MAXILLOFACIAL WITHOUT CONTRAST TECHNIQUE: Multidetector CT imaging of the head and maxillofacial structures were performed using the standard protocol without intravenous contrast. Multiplanar CT image reconstructions of the maxillofacial structures were also generated. RADIATION DOSE REDUCTION: This exam was performed according to the departmental dose-optimization program which includes automated exposure control, adjustment of the mA and/or kV according to patient size and/or use of iterative reconstruction technique. COMPARISON:  None Available. FINDINGS: CT HEAD FINDINGS Brain: No evidence of acute infarction, hemorrhage, hydrocephalus, extra-axial collection or mass lesion/mass effect. Mild periventricular white matter hypodensity. Vascular: No hyperdense vessel or unexpected calcification. CT FACIAL BONES FINDINGS Skull: Normal. Negative for fracture or focal lesion. Mandible: Comminuted, minimally displaced fractures of the mandibular body (series 4, image 15). Additional nondisplaced fractures of the right mandibular condyle and head, extending into the temporomandibular joint, without dislocation of the joint (series 4, image 40). Facial bones: No displaced fractures or dislocations of the facial bones proper. Sinuses/Orbits: No acute finding. Other: Soft tissue laceration of the chin. IMPRESSION: 1. No acute intracranial pathology. Small-vessel white matter disease. 2. Comminuted, minimally displaced fractures of the mandibular body. Additional nondisplaced fractures of the right mandibular condyle and head, extending into the temporomandibular joint, without dislocation of the joint. 3. No  displaced fractures or dislocations of the facial bones proper. 4. Soft tissue laceration of the chin. Electronically Signed   By: Delanna Ahmadi M.D.   On: 01/28/2023 16:01   CT Cervical Spine Wo Contrast  Result Date: 01/28/2023 CLINICAL DATA:  Neck trauma (Age >= 65y) EXAM: CT CERVICAL SPINE WITHOUT CONTRAST TECHNIQUE: Multidetector CT imaging of the cervical spine was performed without intravenous contrast. Multiplanar CT image reconstructions were also generated. RADIATION DOSE REDUCTION: This exam was performed according to the departmental dose-optimization program which includes automated exposure control, adjustment of the mA and/or kV according to patient size and/or use of iterative reconstruction technique. COMPARISON:  None Available. FINDINGS: Alignment: Straightening of the cervical lordosis with slight reversal likely due to patient positioning. There is grade 1 anterolisthesis at C3-C4. grade 1 degenerative anterolisthesis at  C7-T1. Skull base and vertebrae: There is no evidence of acute cervical spine fracture. There is a minimally displaced fracture of the right mandibular condyle. Soft tissues and spinal canal: No prevertebral fluid or swelling. No visible canal hematoma. Disc levels: There is severe degenerative disc disease from C4 through C7. Multilevel facet arthropathy, severe on the left at C3-C4 and on the right at C2-C3 and C7-T1. There is at least mild to moderate bilateral neural foraminal narrowing at C4-C5, C5-C6, and C6-C7. Upper chest: Negative. Other: Vascular calcifications. Apical predominant centrilobular emphysema. IMPRESSION: No evidence acute cervical spine fracture. Partially visualized minimally displaced fracture of the right mandibular condyle. Severe degenerative disc disease from C4 through C7 with bilateral neural foraminal narrowing at these levels. Multilevel facet arthropathy, severe on the left at C3-C4 and on the right at C2-C3 and C7-T1. Electronically Signed    By: Maurine Simmering M.D.   On: 01/28/2023 15:59   DG Hand Complete Right  Result Date: 01/28/2023 CLINICAL DATA:  [place] EXAM: RIGHT HAND - COMPLETE 3+ VIEW COMPARISON:  None Available. FINDINGS: There is no evidence of acute fracture. 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 There is cystic change in the lunate. IMPRESSION: No acute osseous abnormality. Severe thumb CMC joint osteoarthritis. Electronically Signed   By: Maurine Simmering M.D.   On: 01/28/2023 15:51   DG Hand Complete Left  Result Date: 01/28/2023 CLINICAL DATA:  [place] EXAM: LEFT HAND - COMPLETE 3+ VIEW COMPARISON:  None Available. FINDINGS: There is no evidence of acute fracture. 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 There is no bone erosion or periostitis. Soft tissues are unremarkable. IMPRESSION: No acute osseous abnormality. Severe thumb CMC joint osteoarthritis. Electronically Signed   By: Maurine Simmering M.D.   On: 01/28/2023 15:50   DG Wrist Complete Left  Result Date: 01/28/2023 CLINICAL DATA:  Trauma, fall, pain EXAM: LEFT WRIST - COMPLETE 3+ VIEW COMPARISON:  01/28/2023 FINDINGS: Bones are osteopenic and advanced degenerative arthropathy of the left wrist first [facility] joint with joint space loss, sclerosis and bony spurring of the trapezium and first metacarpal articulation. No acute osseous finding, fracture or malalignment. Distal radius, ulna and carpal bones appear intact. No focal soft tissue abnormality. IMPRESSION: Osteopenia and degenerative changes as above. No acute osseous finding by plain radiography. Electronically Signed   By: Jerilynn Mages.  Shick M.D.   On: 01/28/2023 15:37   DG Wrist Complete Right  Result Date: 01/28/2023 CLINICAL DATA:  Fall, trauma, pain EXAM: RIGHT WRIST - COMPLETE 3+ VIEW COMPARISON:  01/28/2023 FINDINGS: Bones are osteopenic. Distal radius, ulna, and carpal bones appear intact. No acute osseous finding or fracture. No malalignment. Degenerative osteoarthritis of the right wrist first [place] joint  with joint space loss, sclerosis and bony spurring involving the trapezium and first metatarsal articulation. Benign cystic change noted in the lunate. No focal soft tissue abnormality or swelling. IMPRESSION: Osteopenia and degenerative changes as above. No acute osseous finding by plain radiography. Electronically Signed   By: Jerilynn Mages.  Shick M.D.   On: 01/28/2023 15:36   DG Knee Complete 4 Views Right  Result Date: 01/28/2023 CLINICAL DATA:  Fall, trauma, pain EXAM: RIGHT KNEE - COMPLETE 4+ VIEW COMPARISON:  01/28/2023 FINDINGS: No evidence of fracture, dislocation, or joint effusion. No evidence of arthropathy or other focal bone abnormality. Soft tissues are unremarkable. IMPRESSION: Negative. Electronically Signed   By: Jerilynn Mages.  Shick M.D.   On: 01/28/2023 15:34   DG Knee Complete 4 Views Left  Result Date: 01/28/2023  CLINICAL DATA:  Fall, trauma, pain EXAM: LEFT KNEE - COMPLETE 4+ VIEW COMPARISON:  None Available. FINDINGS: No evidence of fracture, dislocation, or joint effusion. No evidence of arthropathy or other focal bone abnormality. Soft tissues are unremarkable. IMPRESSION: No acute osseous finding Electronically Signed   By: Jerilynn Mages.  Shick M.D.   On: 01/28/2023 15:31    Procedures .Marland KitchenLaceration Repair  Date/Time: 01/28/2023 7:33 PM  Performed by: Sherrell Puller, PA-C Authorized by: Sherrell Puller, PA-C   Consent:    Consent obtained:  Verbal   Consent given by:  Patient   Risks, benefits, and alternatives were discussed: yes     Risks discussed:  Infection, pain, nerve damage, retained foreign body, poor cosmetic result, tendon damage, poor wound healing, vascular damage and need for additional repair   Alternatives discussed:  No treatment Universal protocol:    Procedure explained and questions answered to patient or proxy's satisfaction: yes     Imaging studies available: yes     Patient identity confirmed:  Verbally with patient Anesthesia:    Anesthesia method:  Topical application and  local infiltration   Topical anesthetic:  LET   Local anesthetic:  Lidocaine 1% w/o epi Laceration details:    Location:  Face   Face location:  Chin   Length (cm):  3.5 Pre-procedure details:    Preparation:  Patient was prepped and draped in usual sterile fashion Exploration:    Hemostasis achieved with:  LET   Wound exploration: wound explored through full range of motion and entire depth of wound visualized     Contaminated: no   Treatment:    Area cleansed with:  Shur-Clens and saline   Amount of cleaning:  Extensive   Irrigation solution:  Sterile water   Irrigation volume:  250   Layers/structures repaired:  Deep subcutaneous Deep subcutaneous:    Suture size:  5-0   Suture material:  Plain gut   Suture technique:  Simple interrupted   Number of sutures:  3 Skin repair:    Repair method:  Sutures   Suture size:  5-0   Suture material:  Plain gut   Suture technique:  Simple interrupted   Number of sutures:  7 Approximation:    Approximation:  Close Repair type:    Repair type:  Intermediate Post-procedure details:    Procedure completion:  Tolerated well, no immediate complications Wound repair  Date/Time: 01/28/2023 7:35 PM  Performed by: Sherrell Puller, PA-C Authorized by: Sherrell Puller, PA-C  Consent: Verbal consent obtained. Risks and benefits: risks, benefits and alternatives were discussed Preparation: Patient was prepped and draped in the usual sterile fashion. Comments: Left knee wound was cleansed with Shur-Clens and irrigated with normal saline.  Apply Xeroform gauze over the knee as well as some Teflon wrapped with an Ace bandage.   Marland Kitchen.Laceration Repair  Date/Time: 01/28/2023 7:36 PM  Performed by: Sherrell Puller, PA-C Authorized by: Sherrell Puller, PA-C   Consent:    Consent obtained:  Verbal   Consent given by:  Patient   Risks, benefits, and alternatives were discussed: yes     Risks discussed:  Infection, pain, nerve damage, need for additional  repair, retained foreign body, poor cosmetic result and tendon damage   Alternatives discussed:  No treatment Universal protocol:    Procedure explained and questions answered to patient or proxy's satisfaction: yes     Imaging studies available: yes     Patient identity confirmed:  Verbally with patient Anesthesia:    Anesthesia method:  Topical application   Topical anesthetic:  LET Laceration details:    Location:  Leg   Leg location:  R knee   Length (cm):  3.5 Pre-procedure details:    Preparation:  Patient was prepped and draped in usual sterile fashion and imaging obtained to evaluate for foreign bodies Exploration:    Imaging obtained: x-ray     Imaging outcome: foreign body not noted     Wound exploration: wound explored through full range of motion and entire depth of wound visualized     Contaminated: no   Treatment:    Area cleansed with:  Shur-Clens and saline   Amount of cleaning:  Standard   Irrigation solution:  Sterile water Skin repair:    Repair method:  Steri-Strips   Number of Steri-Strips:  4 Approximation:    Approximation:  Close Repair type:    Repair type:  Simple Comments:     Skin tear present on the right knee.  Wound was cleansed with Shur-Clens and irrigated with normal saline.  Skin was dried and 4 Steri-Strips were placed in a fanlike fashion.  This is a patient that the top layer of skin will likely shrivel and she is okay with this.    Medications Ordered in ED Medications  fentaNYL (SUBLIMAZE) injection 50 mcg (50 mcg Intravenous Given 01/28/23 1526)  lidocaine-EPINEPHrine-tetracaine (LET) topical gel (3 mLs Topical Given 01/28/23 1526)  lidocaine (PF) (XYLOCAINE) 1 % injection 10 mL (10 mLs Other Given by Other 01/28/23 1538)  Tdap (BOOSTRIX) injection 0.5 mL (0.5 mLs Intramuscular Given 01/28/23 1536)  morphine (PF) 4 MG/ML injection 4 mg (4 mg Intravenous Given 01/28/23 1652)  ceFAZolin (ANCEF) IVPB 2g/100 mL premix (2 g Intravenous New  Bag/Given 01/28/23 1652)    ED Course/ Medical Decision Making/ A&P   Medical Decision Making Amount and/or Complexity of Data Reviewed Radiology: ordered.  Risk Prescription drug management.   77 y.o. female presents to the ER for evaluation of fall with hand, knee, and chin and jaw pain. Differential diagnosis includes but is not limited to fracture, dislocation, contusion, abrasion, laceration. Vital signs unremarkable. Physical exam as noted above.  Please see images.  The patient does not have any chest, back, or belly pain.  Do not think any additional CT imaging is needed at this time.  Will order CT of the head, neck, and maxillofacial given abscesses for any mandibular trauma given the patient's significant pain with opening her jaw.  Will order plain films of the hand, wrist, and knees given the abrasions.  Imaging of the bilateral hands, wrists, and knees show osteopenia with degenerative changes but overall unremarkable for any fractures or dislocations.  CT imaging of the head and maxillofacial shows IMPRESSION: 1. No acute intracranial pathology. Small-vessel white matter disease. 2. Comminuted, minimally displaced fractures of the mandibular body. Additional nondisplaced fractures of the right mandibular condyle and head, extending into the temporomandibular joint, without dislocation of the joint. 3. No displaced fractures or dislocations of the facial bones proper. 4. Soft tissue laceration of the chin.  CT of the cervical spine shows IMPRESSION: No evidence acute cervical spine fracture. Partially visualized minimally displaced fracture of the right mandibular condyle. Severe degenerative disc disease from C4 through C7 with bilateral neural foraminal narrowing at these levels. Multilevel facet arthropathy, severe on the left at C3-C4 and on the right at C2-C3 and C7-T1.   Contacted ENT, Dr. Janace Hoard, who requests that the patient be sent to [facility] for further treatment. Patient  will remain NPO.   Hand wounds were cleansed and dressed by nursing staff.  Skin tears are present to the bilateral palms.  Nothing to suture.  Xeroform gauze placed with Teflon and [place] and for the hands but by nursing after being cleansed.  For her knee wounds, I dressed her knee with some Xeroform gauze and Telfa and wrapped it carefully with an Ace bandage.  For her right knee I was able to place some Steri-Strips given that her skin tear flap was still present.  Patient reports that she thinks her tetanus shot was in 2015 however is not confident in that answer.  Will update tetanus here.  Patient is agreeable.   Please see procedure notes for more information.  Patient has received fentanyl and morphine for pain.  She does become slightly hypoxic with the medication however has no increased work of breathing.  She was placed on 2 L of oxygen for comfort.  Her tetanus shot was updated.  She was also given 2 g of Ancef given that she had open fracture to her chin.  Spoke with Dr. Armandina Gemma who accepts this patient over at [facility]. The patient will need antibiotics and pain medication to go home with, but unsure if she will be able to swallow pills after procedure. Will have accepting providers determine dispo plan and medications.    Portions of this report may have been transcribed using voice recognition software. Every effort was made to ensure accuracy; however, inadvertent computerized transcription errors may be present.    Final Clinical Impression(s) / ED Diagnoses Final diagnoses:  Open fracture of right side of mandibular body, initial encounter ([place])  Closed fracture of right condylar process of mandible, initial encounter ([place])  Abrasions of multiple sites    Rx / DC Orders ED Discharge Orders          Ordered    cefadroxil (DURICEF) 500 MG capsule  2 times daily        01/28/23 1809              Sherrell Puller, PA-C 01/28/23 [place], [place],  DO 01/29/23 [id number]

## 2023-01-28 NOTE — ED Notes (Signed)
Note: after receiving Fentanyl pt. Dropped her SPO2 to 89-91%; at which time we applied O2 at 2 l.p.m. pt. Stayed awake and lucid the entire time.

## 2023-01-28 NOTE — ED Notes (Signed)
Caregiver and neighbor Renie Ora [id number]. Her instructions are to call for anything the pt. May need.

## 2023-01-28 NOTE — Discharge Instructions (Addendum)
You were seen in the ER today for evaluation after your fall.  Your hand, wrist, and knee x-rays showed some osteoporosis however did not show any fracture or break.  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  As mentioned you have multiple fractures to your mandible.  Because this is what we call an open fracture, and has not increased risk of infection.  I am going to place you on a medication called Duricef and she will take twice daily for the next 7 days.  I have included the information for Dr. Janace Hoard into the discharge paperwork.  Please make sure you call him to schedule an appointment for follow-up.  Additionally, have included information for [place] [facility] for you to follow-up about your degenerative disc disease in your neck.  If you have any concerns, new or worsening symptoms, please return to the nearest emergency department for evaluation.  Contact a health care provider if: You have a severe headache. You have more numbness of the face. You have jaw pain that is severe and does not get better with medicine. You have nausea or anxiety that you cannot control. You cannot eat or drink without vomiting. Your swelling or redness gets worse. You have a fever. Get help right away if: You have trouble breathing. You feel like your windpipe (trachea) is tightening. You cannot swallow your saliva. You make high-pitched whistling sounds when you breathe, most often when you breathe out (wheeze). These symptoms may be an emergency. Get help right away. Call 911. Do not wait to see if the symptoms will go away. Do not drive yourself to the hospital.

## 2023-01-28 NOTE — ED Notes (Signed)
Family at bedside. 

## 2023-01-28 NOTE — ED Triage Notes (Signed)
Patient sent from [facility] due to a fall and fx mandible.  50 mcg fent given; on 2L [place].  AAOX4.

## 2023-01-28 NOTE — H&P (View-Only) (Signed)
Reason for Consult:mandible fx Referring Physician: Dr Kim Higgins is an 77 y.o. female.  HPI: hx of fall to chin and now has mandible fracture of the anterior body and right condyle. The condyle is nondisplaced. The occlusion is off. Has pain in the face. She has numbness of the lower lip bilaterally. She has no nasal obstruction. The laceration closed in ER  Past Medical History:  Diagnosis Date   Hypertension    PVD (peripheral vascular disease) ([place])    asymptomatic, mild LSCA stenosis   Tobacco abuse    discontinued    Past Surgical History:  Procedure Laterality Date   CAROTID DUPLEX  05/24/10   RIGHT & LEFT ICA=NO DIAMETER REDUCTION. ORGIN OF LEFT CCA=EQUAL TO OR LESS THAN 70% REDUCTION. ORGIN OF LEFT SUCLAVIIAN=70-99% REDUCTION. LEFT VERTEBRAL=TO-FRO FLOW NOTED.   MYOCARDICAL PERFUSION STUDY  06/23/06   NORMAL PATTREN OF PERFUSION IN ALL REGIONS. EF% NOT ESTIMATED SECONDARY TO INABILITY TO GATE.   TRANSTHORACIC ECHOCARDIOGRAM  03/14/02   EF 60%, MILDLY THICKENED AORTIC VALVE WITH NO STENOSIS. MILD MR. OBSTRUCTION OF TRICUSPID VALVE WITH MILD TR. NORMAL RV SIZE AND FUNCTION.   UPPER ARTERIAL EXAM  05/27/11   LEFT PROXIMAL SUBCLAVIN ARTERY: SL ELEVATED VELOCITIES SUGGESTIVE OF 50% SIGNIFICANT DIAMETER REDUCTION.    Family History  Problem Relation Age of Onset   Hypertension Father    CAD Brother    Heart attack Paternal Uncle    Heart attack Paternal Grandfather     Social History:  reports that she quit smoking about 8 years ago. Her smoking use included cigarettes. She has never used smokeless tobacco. She reports current alcohol use of about 4.0 standard drinks of alcohol per week. No history on file for drug use.  Allergies:  Allergies  Allergen Reactions   Nystatin Diarrhea    Medications: I have reviewed the patient's current medications.  No results found for this or any previous visit (from the past 48 hour(s)).  CT Head Wo Contrast  Result Date:  01/28/2023 CLINICAL DATA:  Fall, broken teeth, concern for mandibular fracture EXAM: CT HEAD WITHOUT CONTRAST CT MAXILLOFACIAL WITHOUT CONTRAST TECHNIQUE: Multidetector CT imaging of the head and maxillofacial structures were performed using the standard protocol without intravenous contrast. Multiplanar CT image reconstructions of the maxillofacial structures were also generated. RADIATION DOSE REDUCTION: This exam was performed according to the departmental dose-optimization program which includes automated exposure control, adjustment of the mA and/or kV according to patient size and/or use of iterative reconstruction technique. COMPARISON:  None Available. FINDINGS: CT HEAD FINDINGS Brain: No evidence of acute infarction, hemorrhage, hydrocephalus, extra-axial collection or mass lesion/mass effect. Mild periventricular white matter hypodensity. Vascular: No hyperdense vessel or unexpected calcification. CT FACIAL BONES FINDINGS Skull: Normal. Negative for fracture or focal lesion. Mandible: Comminuted, minimally displaced fractures of the mandibular body (series 4, image 15). Additional nondisplaced fractures of the right mandibular condyle and head, extending into the temporomandibular joint, without dislocation of the joint (series 4, image 40). Facial bones: No displaced fractures or dislocations of the facial bones proper. Sinuses/Orbits: No acute finding. Other: Soft tissue laceration of the chin. IMPRESSION: 1. No acute intracranial pathology. Small-vessel white matter disease. 2. Comminuted, minimally displaced fractures of the mandibular body. Additional nondisplaced fractures of the right mandibular condyle and head, extending into the temporomandibular joint, without dislocation of the joint. 3. No displaced fractures or dislocations of the facial bones proper. 4. Soft tissue laceration of the chin. Electronically Signed   By:  Delanna Ahmadi M.D.   On: 01/28/2023 16:01   CT Maxillofacial Wo  Contrast  Result Date: 01/28/2023 CLINICAL DATA:  Fall, broken teeth, concern for mandibular fracture EXAM: CT HEAD WITHOUT CONTRAST CT MAXILLOFACIAL WITHOUT CONTRAST TECHNIQUE: Multidetector CT imaging of the head and maxillofacial structures were performed using the standard protocol without intravenous contrast. Multiplanar CT image reconstructions of the maxillofacial structures were also generated. RADIATION DOSE REDUCTION: This exam was performed according to the departmental dose-optimization program which includes automated exposure control, adjustment of the mA and/or kV according to patient size and/or use of iterative reconstruction technique. COMPARISON:  None Available. FINDINGS: CT HEAD FINDINGS Brain: No evidence of acute infarction, hemorrhage, hydrocephalus, extra-axial collection or mass lesion/mass effect. Mild periventricular white matter hypodensity. Vascular: No hyperdense vessel or unexpected calcification. CT FACIAL BONES FINDINGS Skull: Normal. Negative for fracture or focal lesion. Mandible: Comminuted, minimally displaced fractures of the mandibular body (series 4, image 15). Additional nondisplaced fractures of the right mandibular condyle and head, extending into the temporomandibular joint, without dislocation of the joint (series 4, image 40). Facial bones: No displaced fractures or dislocations of the facial bones proper. Sinuses/Orbits: No acute finding. Other: Soft tissue laceration of the chin. IMPRESSION: 1. No acute intracranial pathology. Small-vessel white matter disease. 2. Comminuted, minimally displaced fractures of the mandibular body. Additional nondisplaced fractures of the right mandibular condyle and head, extending into the temporomandibular joint, without dislocation of the joint. 3. No displaced fractures or dislocations of the facial bones proper. 4. Soft tissue laceration of the chin. Electronically Signed   By: Delanna Ahmadi M.D.   On: 01/28/2023 16:01   CT  Cervical Spine Wo Contrast  Result Date: 01/28/2023 CLINICAL DATA:  Neck trauma (Age >= 65y) EXAM: CT CERVICAL SPINE WITHOUT CONTRAST TECHNIQUE: Multidetector CT imaging of the cervical spine was performed without intravenous contrast. Multiplanar CT image reconstructions were also generated. RADIATION DOSE REDUCTION: This exam was performed according to the departmental dose-optimization program which includes automated exposure control, adjustment of the mA and/or kV according to patient size and/or use of iterative reconstruction technique. COMPARISON:  None Available. FINDINGS: Alignment: Straightening of the cervical lordosis with slight reversal likely due to patient positioning. There is grade 1 anterolisthesis at C3-C4. grade 1 degenerative anterolisthesis at C7-T1. Skull base and vertebrae: There is no evidence of acute cervical spine fracture. There is a minimally displaced fracture of the right mandibular condyle. Soft tissues and spinal canal: No prevertebral fluid or swelling. No visible canal hematoma. Disc levels: There is severe degenerative disc disease from C4 through C7. Multilevel facet arthropathy, severe on the left at C3-C4 and on the right at C2-C3 and C7-T1. There is at least mild to moderate bilateral neural foraminal narrowing at C4-C5, C5-C6, and C6-C7. Upper chest: Negative. Other: Vascular calcifications. Apical predominant centrilobular emphysema. IMPRESSION: No evidence acute cervical spine fracture. Partially visualized minimally displaced fracture of the right mandibular condyle. Severe degenerative disc disease from C4 through C7 with bilateral neural foraminal narrowing at these levels. Multilevel facet arthropathy, severe on the left at C3-C4 and on the right at C2-C3 and C7-T1. Electronically Signed   By: Maurine Simmering M.D.   On: 01/28/2023 15:59   DG Hand Complete Right  Result Date: 01/28/2023 CLINICAL DATA:  [place] EXAM: RIGHT HAND - COMPLETE 3+ VIEW COMPARISON:  None  Available. FINDINGS: There is no evidence of acute fracture. 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 There is cystic change in the lunate. IMPRESSION: No acute  osseous abnormality. Severe thumb CMC joint osteoarthritis. Electronically Signed   By: Maurine Simmering M.D.   On: 01/28/2023 15:51   DG Hand Complete Left  Result Date: 01/28/2023 CLINICAL DATA:  [place] EXAM: LEFT HAND - COMPLETE 3+ VIEW COMPARISON:  None Available. FINDINGS: There is no evidence of acute fracture. 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 There is no bone erosion or periostitis. Soft tissues are unremarkable. IMPRESSION: No acute osseous abnormality. Severe thumb CMC joint osteoarthritis. Electronically Signed   By: Maurine Simmering M.D.   On: 01/28/2023 15:50   DG Wrist Complete Left  Result Date: 01/28/2023 CLINICAL DATA:  Trauma, fall, pain EXAM: LEFT WRIST - COMPLETE 3+ VIEW COMPARISON:  01/28/2023 FINDINGS: Bones are osteopenic and advanced degenerative arthropathy of the left wrist first [facility] joint with joint space loss, sclerosis and bony spurring of the trapezium and first metacarpal articulation. No acute osseous finding, fracture or malalignment. Distal radius, ulna and carpal bones appear intact. No focal soft tissue abnormality. IMPRESSION: Osteopenia and degenerative changes as above. No acute osseous finding by plain radiography. Electronically Signed   By: Jerilynn Mages.  Shick M.D.   On: 01/28/2023 15:37   DG Wrist Complete Right  Result Date: 01/28/2023 CLINICAL DATA:  Fall, trauma, pain EXAM: RIGHT WRIST - COMPLETE 3+ VIEW COMPARISON:  01/28/2023 FINDINGS: Bones are osteopenic. Distal radius, ulna, and carpal bones appear intact. No acute osseous finding or fracture. No malalignment. Degenerative osteoarthritis of the right wrist first [place] joint with joint space loss, sclerosis and bony spurring involving the trapezium and first metatarsal articulation. Benign cystic change noted in the lunate. No focal soft tissue  abnormality or swelling. IMPRESSION: Osteopenia and degenerative changes as above. No acute osseous finding by plain radiography. Electronically Signed   By: Jerilynn Mages.  Shick M.D.   On: 01/28/2023 15:36   DG Knee Complete 4 Views Right  Result Date: 01/28/2023 CLINICAL DATA:  Fall, trauma, pain EXAM: RIGHT KNEE - COMPLETE 4+ VIEW COMPARISON:  01/28/2023 FINDINGS: No evidence of fracture, dislocation, or joint effusion. No evidence of arthropathy or other focal bone abnormality. Soft tissues are unremarkable. IMPRESSION: Negative. Electronically Signed   By: Jerilynn Mages.  Shick M.D.   On: 01/28/2023 15:34   DG Knee Complete 4 Views Left  Result Date: 01/28/2023 CLINICAL DATA:  Fall, trauma, pain EXAM: LEFT KNEE - COMPLETE 4+ VIEW COMPARISON:  None Available. FINDINGS: No evidence of fracture, dislocation, or joint effusion. No evidence of arthropathy or other focal bone abnormality. Soft tissues are unremarkable. IMPRESSION: No acute osseous finding Electronically Signed   By: Jerilynn Mages.  Shick M.D.   On: 01/28/2023 15:31    ROS Blood pressure (!) 149/69, pulse 93, temperature 98.4 F (36.9 C), temperature source Oral, resp. rate 18, height 5\' 3"  (1.6 m), SpO2 97 %. Physical Exam HENT:     Head: Normocephalic.     Nose: Nose normal.     Mouth/Throat:     Comments: Blood in the mouth and the occlusion is not closing on the left. The right posterior teeth are absent. She has wound covered with gauze. She is numb in the lower lip.  Eyes:     Extraocular Movements: Extraocular movements intact.     Conjunctiva/sclera: Conjunctivae normal.     Pupils: Pupils are equal, round, and reactive to light.  Musculoskeletal:     Cervical back: Normal range of motion.  Neurological:     Mental Status: She is alert.       Assessment/Plan: Mandible fracture- she  has anterior complex fractures and condyle. She will need ORIF and MMF. She understands her occlusion will not be the same after the surgery. We discussed the risks,  benefits and options. All questions answered and consent obtained.   Kim Higgins 01/28/2023, 7:41 PM

## 2023-01-28 NOTE — ED Notes (Signed)
Patient transported to CT 

## 2023-01-28 NOTE — ED Triage Notes (Signed)
Pt reports tripping while walking down the street. Pt has laceration to the chin and wounds to the palms of her hands and knees.

## 2023-01-28 NOTE — ED Provider Notes (Signed)
Patient transferred to this facility from [facility] ER for evaluation by ENT.  Patient was reportedly walking, tripped over her shoe and landed on her knees and hands, hitting her chin on asphalt.  Patient had multiple broken teeth in the lower front jaw and a 3.5 cm laceration to the chin.  Patient with abrasions to hands and knee as well.  Patient was administered pain medication and Tdap prior to transfer.  Patient had laceration repair of the chin and right knee prior to transfer. Physical Exam  BP (!) 149/69   Pulse 93   Temp 98.4 F (36.9 C) (Oral)   Resp 18   Ht 5\' 3"  (1.6 m)   SpO2 97%   BMI 20.19 kg/m   Physical Exam  Procedures  Procedures  ED Course / MDM    Medical Decision Making Amount and/or Complexity of Data Reviewed Radiology: ordered.  Risk Prescription drug management.   Dr.Byers, ENT, evaluated the patient and plans to take her to the operating room as soon as there is availability.  Normal saline ordered at 100 mL/h at request of ENT.   Patient will stay in the emergency department overnight due to OR availability, plan for surgical fixation tomorrow.  Patient will be n.p.o. at midnight.    Ronny Bacon 01/28/23 2217    Regan Lemming, MD 01/29/23 [phone]

## 2023-01-28 NOTE — Consult Note (Signed)
Reason for Consult:mandible fx Referring Physician: [name] is an 77 y.o. female.  HPI: hx of fall to chin and now has mandible fracture of the anterior body and right condyle. The condyle is nondisplaced. The occlusion is off. Has pain in the face. She has numbness of the lower lip bilaterally. She has no nasal obstruction. The laceration closed in ER  Past Medical History:  Diagnosis Date   Hypertension    PVD (peripheral vascular disease) ([place])    asymptomatic, mild LSCA stenosis   Tobacco abuse    discontinued    Past Surgical History:  Procedure Laterality Date   CAROTID DUPLEX  05/24/10   RIGHT & LEFT ICA=NO DIAMETER REDUCTION. ORGIN OF LEFT CCA=EQUAL TO OR LESS THAN 70% REDUCTION. ORGIN OF LEFT SUCLAVIIAN=70-99% REDUCTION. LEFT VERTEBRAL=TO-FRO FLOW NOTED.   MYOCARDICAL PERFUSION STUDY  06/23/06   NORMAL PATTREN OF PERFUSION IN ALL REGIONS. EF% NOT ESTIMATED SECONDARY TO INABILITY TO GATE.   TRANSTHORACIC ECHOCARDIOGRAM  03/14/02   EF 60%, MILDLY THICKENED AORTIC VALVE WITH NO STENOSIS. MILD MR. OBSTRUCTION OF TRICUSPID VALVE WITH MILD TR. NORMAL RV SIZE AND FUNCTION.   UPPER ARTERIAL EXAM  05/27/11   LEFT PROXIMAL SUBCLAVIN ARTERY: SL ELEVATED VELOCITIES SUGGESTIVE OF 50% SIGNIFICANT DIAMETER REDUCTION.    Family History  Problem Relation Age of Onset   Hypertension Father    CAD Brother    Heart attack Paternal Uncle    Heart attack Paternal Grandfather     Social History:  reports that she quit smoking about 8 years ago. Her smoking use included cigarettes. She has never used smokeless tobacco. She reports current alcohol use of about 4.0 standard drinks of alcohol per week. No history on file for drug use.  Allergies:  Allergies  Allergen Reactions   Nystatin Diarrhea    Medications: I have reviewed the patient's current medications.  No results found for this or any previous visit (from the past 48 hour(s)).  CT Head Wo Contrast  Result Date:  01/28/2023 CLINICAL DATA:  Fall, broken teeth, concern for mandibular fracture EXAM: CT HEAD WITHOUT CONTRAST CT MAXILLOFACIAL WITHOUT CONTRAST TECHNIQUE: Multidetector CT imaging of the head and maxillofacial structures were performed using the standard protocol without intravenous contrast. Multiplanar CT image reconstructions of the maxillofacial structures were also generated. RADIATION DOSE REDUCTION: This exam was performed according to the departmental dose-optimization program which includes automated exposure control, adjustment of the mA and/or kV according to patient size and/or use of iterative reconstruction technique. COMPARISON:  None Available. FINDINGS: CT HEAD FINDINGS Brain: No evidence of acute infarction, hemorrhage, hydrocephalus, extra-axial collection or mass lesion/mass effect. Mild periventricular white matter hypodensity. Vascular: No hyperdense vessel or unexpected calcification. CT FACIAL BONES FINDINGS Skull: Normal. Negative for fracture or focal lesion. Mandible: Comminuted, minimally displaced fractures of the mandibular body (series 4, image 15). Additional nondisplaced fractures of the right mandibular condyle and head, extending into the temporomandibular joint, without dislocation of the joint (series 4, image 40). Facial bones: No displaced fractures or dislocations of the facial bones proper. Sinuses/Orbits: No acute finding. Other: Soft tissue laceration of the chin. IMPRESSION: 1. No acute intracranial pathology. Small-vessel white matter disease. 2. Comminuted, minimally displaced fractures of the mandibular body. Additional nondisplaced fractures of the right mandibular condyle and head, extending into the temporomandibular joint, without dislocation of the joint. 3. No displaced fractures or dislocations of the facial bones proper. 4. Soft tissue laceration of the chin. Electronically Signed   By:  Delanna Ahmadi M.D.   On: 01/28/2023 16:01   CT Maxillofacial Wo  Contrast  Result Date: 01/28/2023 CLINICAL DATA:  Fall, broken teeth, concern for mandibular fracture EXAM: CT HEAD WITHOUT CONTRAST CT MAXILLOFACIAL WITHOUT CONTRAST TECHNIQUE: Multidetector CT imaging of the head and maxillofacial structures were performed using the standard protocol without intravenous contrast. Multiplanar CT image reconstructions of the maxillofacial structures were also generated. RADIATION DOSE REDUCTION: This exam was performed according to the departmental dose-optimization program which includes automated exposure control, adjustment of the mA and/or kV according to patient size and/or use of iterative reconstruction technique. COMPARISON:  None Available. FINDINGS: CT HEAD FINDINGS Brain: No evidence of acute infarction, hemorrhage, hydrocephalus, extra-axial collection or mass lesion/mass effect. Mild periventricular white matter hypodensity. Vascular: No hyperdense vessel or unexpected calcification. CT FACIAL BONES FINDINGS Skull: Normal. Negative for fracture or focal lesion. Mandible: Comminuted, minimally displaced fractures of the mandibular body (series 4, image 15). Additional nondisplaced fractures of the right mandibular condyle and head, extending into the temporomandibular joint, without dislocation of the joint (series 4, image 40). Facial bones: No displaced fractures or dislocations of the facial bones proper. Sinuses/Orbits: No acute finding. Other: Soft tissue laceration of the chin. IMPRESSION: 1. No acute intracranial pathology. Small-vessel white matter disease. 2. Comminuted, minimally displaced fractures of the mandibular body. Additional nondisplaced fractures of the right mandibular condyle and head, extending into the temporomandibular joint, without dislocation of the joint. 3. No displaced fractures or dislocations of the facial bones proper. 4. Soft tissue laceration of the chin. Electronically Signed   By: Delanna Ahmadi M.D.   On: 01/28/2023 16:01   CT  Cervical Spine Wo Contrast  Result Date: 01/28/2023 CLINICAL DATA:  Neck trauma (Age >= 65y) EXAM: CT CERVICAL SPINE WITHOUT CONTRAST TECHNIQUE: Multidetector CT imaging of the cervical spine was performed without intravenous contrast. Multiplanar CT image reconstructions were also generated. RADIATION DOSE REDUCTION: This exam was performed according to the departmental dose-optimization program which includes automated exposure control, adjustment of the mA and/or kV according to patient size and/or use of iterative reconstruction technique. COMPARISON:  None Available. FINDINGS: Alignment: Straightening of the cervical lordosis with slight reversal likely due to patient positioning. There is grade 1 anterolisthesis at C3-C4. grade 1 degenerative anterolisthesis at C7-T1. Skull base and vertebrae: There is no evidence of acute cervical spine fracture. There is a minimally displaced fracture of the right mandibular condyle. Soft tissues and spinal canal: No prevertebral fluid or swelling. No visible canal hematoma. Disc levels: There is severe degenerative disc disease from C4 through C7. Multilevel facet arthropathy, severe on the left at C3-C4 and on the right at C2-C3 and C7-T1. There is at least mild to moderate bilateral neural foraminal narrowing at C4-C5, C5-C6, and C6-C7. Upper chest: Negative. Other: Vascular calcifications. Apical predominant centrilobular emphysema. IMPRESSION: No evidence acute cervical spine fracture. Partially visualized minimally displaced fracture of the right mandibular condyle. Severe degenerative disc disease from C4 through C7 with bilateral neural foraminal narrowing at these levels. Multilevel facet arthropathy, severe on the left at C3-C4 and on the right at C2-C3 and C7-T1. Electronically Signed   By: Maurine Simmering M.D.   On: 01/28/2023 15:59   DG Hand Complete Right  Result Date: 01/28/2023 CLINICAL DATA:  [place] EXAM: RIGHT HAND - COMPLETE 3+ VIEW COMPARISON:  None  Available. FINDINGS: There is no evidence of acute fracture. 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 There is cystic change in the lunate. IMPRESSION: No acute  osseous abnormality. Severe thumb CMC joint osteoarthritis. Electronically Signed   By: Maurine Simmering M.D.   On: 01/28/2023 15:51   DG Hand Complete Left  Result Date: 01/28/2023 CLINICAL DATA:  [place] EXAM: LEFT HAND - COMPLETE 3+ VIEW COMPARISON:  None Available. FINDINGS: There is no evidence of acute fracture. 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 There is no bone erosion or periostitis. Soft tissues are unremarkable. IMPRESSION: No acute osseous abnormality. Severe thumb CMC joint osteoarthritis. Electronically Signed   By: Maurine Simmering M.D.   On: 01/28/2023 15:50   DG Wrist Complete Left  Result Date: 01/28/2023 CLINICAL DATA:  Trauma, fall, pain EXAM: LEFT WRIST - COMPLETE 3+ VIEW COMPARISON:  01/28/2023 FINDINGS: Bones are osteopenic and advanced degenerative arthropathy of the left wrist first [facility] joint with joint space loss, sclerosis and bony spurring of the trapezium and first metacarpal articulation. No acute osseous finding, fracture or malalignment. Distal radius, ulna and carpal bones appear intact. No focal soft tissue abnormality. IMPRESSION: Osteopenia and degenerative changes as above. No acute osseous finding by plain radiography. Electronically Signed   By: Jerilynn Mages.  Shick M.D.   On: 01/28/2023 15:37   DG Wrist Complete Right  Result Date: 01/28/2023 CLINICAL DATA:  Fall, trauma, pain EXAM: RIGHT WRIST - COMPLETE 3+ VIEW COMPARISON:  01/28/2023 FINDINGS: Bones are osteopenic. Distal radius, ulna, and carpal bones appear intact. No acute osseous finding or fracture. No malalignment. Degenerative osteoarthritis of the right wrist first [place] joint with joint space loss, sclerosis and bony spurring involving the trapezium and first metatarsal articulation. Benign cystic change noted in the lunate. No focal soft tissue  abnormality or swelling. IMPRESSION: Osteopenia and degenerative changes as above. No acute osseous finding by plain radiography. Electronically Signed   By: Jerilynn Mages.  Shick M.D.   On: 01/28/2023 15:36   DG Knee Complete 4 Views Right  Result Date: 01/28/2023 CLINICAL DATA:  Fall, trauma, pain EXAM: RIGHT KNEE - COMPLETE 4+ VIEW COMPARISON:  01/28/2023 FINDINGS: No evidence of fracture, dislocation, or joint effusion. No evidence of arthropathy or other focal bone abnormality. Soft tissues are unremarkable. IMPRESSION: Negative. Electronically Signed   By: Jerilynn Mages.  Shick M.D.   On: 01/28/2023 15:34   DG Knee Complete 4 Views Left  Result Date: 01/28/2023 CLINICAL DATA:  Fall, trauma, pain EXAM: LEFT KNEE - COMPLETE 4+ VIEW COMPARISON:  None Available. FINDINGS: No evidence of fracture, dislocation, or joint effusion. No evidence of arthropathy or other focal bone abnormality. Soft tissues are unremarkable. IMPRESSION: No acute osseous finding Electronically Signed   By: Jerilynn Mages.  Shick M.D.   On: 01/28/2023 15:31    ROS Blood pressure (!) 149/69, pulse 93, temperature 98.4 F (36.9 C), temperature source Oral, resp. rate 18, height 5\' 3"  (1.6 m), SpO2 97 %. Physical Exam HENT:     Head: Normocephalic.     Nose: Nose normal.     Mouth/Throat:     Comments: Blood in the mouth and the occlusion is not closing on the left. The right posterior teeth are absent. She has wound covered with gauze. She is numb in the lower lip.  Eyes:     Extraocular Movements: Extraocular movements intact.     Conjunctiva/sclera: Conjunctivae normal.     Pupils: Pupils are equal, round, and reactive to light.  Musculoskeletal:     Cervical back: Normal range of motion.  Neurological:     Mental Status: She is alert.       Assessment/Plan: Mandible fracture- she  has anterior complex fractures and condyle. She will need ORIF and MMF. She understands her occlusion will not be the same after the surgery. We discussed the risks,  benefits and options. All questions answered and consent obtained.   Melissa Montane 01/28/2023, 7:41 PM

## 2023-01-28 NOTE — ED Notes (Signed)
Called [facility] -- spoke to [facility]; informed that patient needs ED to ED tx via [facility]; accepting -- Regan Lemming, MD

## 2023-01-29 ENCOUNTER — Encounter (HOSPITAL_COMMUNITY): Payer: Self-pay

## 2023-01-29 ENCOUNTER — Emergency Department (HOSPITAL_COMMUNITY): Payer: Medicare Other | Admitting: Certified Registered Nurse Anesthetist

## 2023-01-29 ENCOUNTER — Other Ambulatory Visit: Payer: Self-pay

## 2023-01-29 ENCOUNTER — Emergency Department (HOSPITAL_BASED_OUTPATIENT_CLINIC_OR_DEPARTMENT_OTHER): Payer: Medicare Other | Admitting: Certified Registered Nurse Anesthetist

## 2023-01-29 ENCOUNTER — Encounter (HOSPITAL_COMMUNITY)
Admission: EM | Disposition: A | Discharge status: Home / Self Care | Payer: Self-pay | Source: Home / Self Care | Attending: Emergency Medicine

## 2023-01-29 ENCOUNTER — Ambulatory Visit (HOSPITAL_COMMUNITY): Payer: Medicare Other

## 2023-01-29 DIAGNOSIS — K219 Gastro-esophageal reflux disease without esophagitis: Secondary | ICD-10-CM | POA: Diagnosis not present

## 2023-01-29 DIAGNOSIS — S02609A Fracture of mandible, unspecified, initial encounter for closed fracture: Secondary | ICD-10-CM

## 2023-01-29 DIAGNOSIS — W19XXXA Unspecified fall, initial encounter: Secondary | ICD-10-CM

## 2023-01-29 DIAGNOSIS — J441 Chronic obstructive pulmonary disease with (acute) exacerbation: Secondary | ICD-10-CM | POA: Diagnosis not present

## 2023-01-29 DIAGNOSIS — Y92009 Unspecified place in unspecified non-institutional (private) residence as the place of occurrence of the external cause: Secondary | ICD-10-CM

## 2023-01-29 DIAGNOSIS — I10 Essential (primary) hypertension: Secondary | ICD-10-CM | POA: Diagnosis not present

## 2023-01-29 DIAGNOSIS — J9601 Acute respiratory failure with hypoxia: Secondary | ICD-10-CM

## 2023-01-29 DIAGNOSIS — S02611A Fracture of condylar process of right mandible, initial encounter for closed fracture: Secondary | ICD-10-CM | POA: Diagnosis not present

## 2023-01-29 DIAGNOSIS — R0902 Hypoxemia: Secondary | ICD-10-CM | POA: Diagnosis not present

## 2023-01-29 DIAGNOSIS — S02601A Fracture of unspecified part of body of right mandible, initial encounter for closed fracture: Secondary | ICD-10-CM | POA: Diagnosis not present

## 2023-01-29 HISTORY — PX: ORIF MANDIBULAR FRACTURE: SHX2127

## 2023-01-29 LAB — CBC
HCT: 36.4 % (ref 36.0–46.0)
HCT: 40 % (ref 36.0–46.0)
Hemoglobin: 12.2 g/dL (ref 12.0–15.0)
Hemoglobin: 12.7 g/dL (ref 12.0–15.0)
MCH: 32.8 pg (ref 26.0–34.0)
MCH: 31.9 pg (ref 26.0–34.0)
MCHC: 33.5 g/dL (ref 30.0–36.0)
MCHC: 31.8 g/dL (ref 30.0–36.0)
MCV: 97.8 fL (ref 80.0–100.0)
MCV: 100.5 fL — ABNORMAL HIGH (ref 80.0–100.0)
Platelets: 179 10*3/uL (ref 150–400)
Platelets: 207 10*3/uL (ref 150–400)
RBC: 3.72 MIL/uL — ABNORMAL LOW (ref 3.87–5.11)
RBC: 3.98 MIL/uL (ref 3.87–5.11)
RDW: 14.4 % (ref 11.5–15.5)
RDW: 14.6 % (ref 11.5–15.5)
WBC: 14.5 10*3/uL — ABNORMAL HIGH (ref 4.0–10.5)
WBC: 13.8 10*3/uL — ABNORMAL HIGH (ref 4.0–10.5)
nRBC: 0 % (ref 0.0–0.2)
nRBC: 0 % (ref 0.0–0.2)

## 2023-01-29 LAB — COMPREHENSIVE METABOLIC PANEL
ALT: 26 U/L (ref 0–44)
AST: 35 U/L (ref 15–41)
Albumin: 3.4 g/dL — ABNORMAL LOW (ref 3.5–5.0)
Alkaline Phosphatase: 69 U/L (ref 38–126)
Anion gap: 12 (ref 5–15)
BUN: 15 mg/dL (ref 8–23)
CO2: 21 mmol/L — ABNORMAL LOW (ref 22–32)
Calcium: 8.6 mg/dL — ABNORMAL LOW (ref 8.9–10.3)
Chloride: 95 mmol/L — ABNORMAL LOW (ref 98–111)
Creatinine, Ser: 0.88 mg/dL (ref 0.44–1.00)
GFR, Estimated: >60 mL/min (ref >60)
Glucose, Bld: 106 mg/dL — ABNORMAL HIGH (ref 70–99)
Potassium: 4.6 mmol/L (ref 3.5–5.1)
Sodium: 128 mmol/L — ABNORMAL LOW (ref 135–145)
Total Bilirubin: 1.2 mg/dL (ref 0.3–1.2)
Total Protein: 6.3 g/dL — ABNORMAL LOW (ref 6.5–8.1)

## 2023-01-29 SURGERY — OPEN REDUCTION INTERNAL FIXATION (ORIF) MANDIBULAR FRACTURE
Anesthesia: General

## 2023-01-29 MED ORDER — DOUBLE ANTIBIOTIC 500-10000 UNIT/GM EX OINT
TOPICAL_OINTMENT | CUTANEOUS | Status: AC
  Filled 2023-01-29: qty 28.4

## 2023-01-29 MED ORDER — 0.9 % SODIUM CHLORIDE (POUR BTL) OPTIME
TOPICAL | Status: DC | PRN
  Administered 2023-01-29: 1000 mL

## 2023-01-29 MED ORDER — POLYETHYLENE GLYCOL 3350 17 G PO PACK: 17.0000 g | PACK | Freq: Every day | ORAL | Status: DC | PRN

## 2023-01-29 MED ORDER — ACETAMINOPHEN 10 MG/ML IV SOLN
INTRAVENOUS | Status: DC | PRN
  Administered 2023-01-29: 1000 mg via INTRAVENOUS

## 2023-01-29 MED ORDER — SUCCINYLCHOLINE CHLORIDE 200 MG/10ML IV SOSY
PREFILLED_SYRINGE | INTRAVENOUS | Status: DC | PRN
  Administered 2023-01-29: 100 mg via INTRAVENOUS

## 2023-01-29 MED ORDER — ACETAMINOPHEN 10 MG/ML IV SOLN
INTRAVENOUS | Status: AC
  Filled 2023-01-29: qty 100

## 2023-01-29 MED ORDER — SUGAMMADEX SODIUM 200 MG/2ML IV SOLN
INTRAVENOUS | Status: DC | PRN
  Administered 2023-01-29: 175 mg via INTRAVENOUS
  Administered 2023-01-29: 50 mg via INTRAVENOUS
  Administered 2023-01-29: 25 mg via INTRAVENOUS

## 2023-01-29 MED ORDER — HYDROMORPHONE HCL 1 MG/ML IJ SOLN: 0.5000 mg | INTRAMUSCULAR | Status: DC | PRN

## 2023-01-29 MED ORDER — BSS IO SOLN
15.0000 mL | Freq: Once | INTRAOCULAR | Status: AC
  Administered 2023-01-29: 15 mL
  Filled 2023-01-29: qty 15

## 2023-01-29 MED ORDER — SODIUM CHLORIDE 0.9% FLUSH
3.0000 mL | Freq: Two times a day (BID) | INTRAVENOUS | Status: DC
  Administered 2023-01-29 (×2): 3 mL via INTRAVENOUS

## 2023-01-29 MED ORDER — PHENYLEPHRINE HCL-NACL 20-0.9 MG/250ML-% IV SOLN
INTRAVENOUS | Status: DC | PRN
  Administered 2023-01-29: 35 ug/min via INTRAVENOUS

## 2023-01-29 MED ORDER — CHLORHEXIDINE GLUCONATE 0.12 % MT SOLN
15.0000 mL | Freq: Once | OROMUCOSAL | Status: AC
  Filled 2023-01-29: qty 15

## 2023-01-29 MED ORDER — LACTATED RINGERS IV SOLN: INTRAVENOUS | Status: DC

## 2023-01-29 MED ORDER — KETOROLAC TROMETHAMINE 0.5 % OP SOLN
1.0000 [drp] | Freq: Four times a day (QID) | OPHTHALMIC | Status: AC
  Administered 2023-01-29 – 2023-01-30 (×4): 1 [drp] via OPHTHALMIC
  Filled 2023-01-29: qty 5

## 2023-01-29 MED ORDER — KETOROLAC TROMETHAMINE 0.5 % OP SOLN
OPHTHALMIC | Status: AC
  Administered 2023-01-29: 2 [drp]
  Filled 2023-01-29: qty 5

## 2023-01-29 MED ORDER — ORAL CARE MOUTH RINSE: 15.0000 mL | Freq: Once | OROMUCOSAL | Status: AC

## 2023-01-29 MED ORDER — FENTANYL CITRATE (PF) 250 MCG/5ML IJ SOLN
INTRAMUSCULAR | Status: AC
  Filled 2023-01-29: qty 5

## 2023-01-29 MED ORDER — DEXAMETHASONE SODIUM PHOSPHATE 10 MG/ML IJ SOLN
INTRAMUSCULAR | Status: DC | PRN
  Administered 2023-01-29: 10 mg via INTRAVENOUS

## 2023-01-29 MED ORDER — CHLORHEXIDINE GLUCONATE 0.12 % MT SOLN: 15.0000 mL | Freq: Once | OROMUCOSAL | Status: DC

## 2023-01-29 MED ORDER — REVEFENACIN 175 MCG/3ML IN SOLN
175.0000 ug | Freq: Every day | RESPIRATORY_TRACT | Status: DC
  Administered 2023-01-30: 175 ug via RESPIRATORY_TRACT
  Filled 2023-01-29 (×2): qty 3

## 2023-01-29 MED ORDER — OXYMETAZOLINE HCL 0.05 % NA SOLN
NASAL | Status: DC | PRN
  Administered 2023-01-29: 2 via NASAL

## 2023-01-29 MED ORDER — HYDROCODONE-ACETAMINOPHEN 5-325 MG PO TABS: 1.0000 | ORAL_TABLET | Freq: Four times a day (QID) | ORAL | 0 refills | Status: DC | PRN

## 2023-01-29 MED ORDER — PROPOFOL 10 MG/ML IV BOLUS
INTRAVENOUS | Status: DC | PRN
  Administered 2023-01-29: 90 mg via INTRAVENOUS

## 2023-01-29 MED ORDER — HYDROCODONE-ACETAMINOPHEN 5-325 MG PO TABS: 1.0000 | ORAL_TABLET | Freq: Four times a day (QID) | ORAL | Status: DC | PRN

## 2023-01-29 MED ORDER — FAMOTIDINE 20 MG PO TABS: 20.0000 mg | ORAL_TABLET | Freq: Every day | ORAL | Status: DC

## 2023-01-29 MED ORDER — FENTANYL CITRATE (PF) 250 MCG/5ML IJ SOLN
INTRAMUSCULAR | Status: DC | PRN
  Administered 2023-01-29 (×3): 50 ug via INTRAVENOUS
  Administered 2023-01-29: 100 ug via INTRAVENOUS

## 2023-01-29 MED ORDER — ORAL CARE MOUTH RINSE: 15.0000 mL | Freq: Once | OROMUCOSAL | Status: DC

## 2023-01-29 MED ORDER — PROPOFOL 10 MG/ML IV BOLUS
INTRAVENOUS | Status: AC
  Filled 2023-01-29: qty 20

## 2023-01-29 MED ORDER — ACETAMINOPHEN 650 MG RE SUPP: 650.0000 mg | Freq: Four times a day (QID) | RECTAL | Status: DC | PRN

## 2023-01-29 MED ORDER — CHLORHEXIDINE GLUCONATE 0.12 % MT SOLN
OROMUCOSAL | Status: AC
  Administered 2023-01-29: 15 mL via OROMUCOSAL
  Filled 2023-01-29: qty 15

## 2023-01-29 MED ORDER — LIDOCAINE-EPINEPHRINE 1 %-1:100000 IJ SOLN
INTRAMUSCULAR | Status: DC | PRN
  Administered 2023-01-29: 4.5 mL

## 2023-01-29 MED ORDER — ONDANSETRON HCL 4 MG/2ML IJ SOLN
INTRAMUSCULAR | Status: DC | PRN
  Administered 2023-01-29: 4 mg via INTRAVENOUS

## 2023-01-29 MED ORDER — ACETAMINOPHEN 325 MG PO TABS
650.0000 mg | ORAL_TABLET | Freq: Four times a day (QID) | ORAL | Status: DC | PRN
  Administered 2023-01-30: 650 mg via ORAL
  Filled 2023-01-29: qty 2

## 2023-01-29 MED ORDER — ARFORMOTEROL TARTRATE 15 MCG/2ML IN NEBU
15.0000 ug | INHALATION_SOLUTION | Freq: Two times a day (BID) | RESPIRATORY_TRACT | Status: DC
  Administered 2023-01-30: 15 ug via RESPIRATORY_TRACT
  Filled 2023-01-29 (×2): qty 2

## 2023-01-29 MED ORDER — CLINDAMYCIN HCL 300 MG PO CAPS: 300.0000 mg | ORAL_CAPSULE | Freq: Three times a day (TID) | ORAL | 0 refills | Status: AC

## 2023-01-29 MED ORDER — BUPROPION HCL ER (XL) 300 MG PO TB24
300.0000 mg | ORAL_TABLET | Freq: Every day | ORAL | Status: DC
  Administered 2023-01-30: 300 mg via ORAL
  Filled 2023-01-29: qty 1

## 2023-01-29 MED ORDER — BSS IO SOLN
INTRAOCULAR | Status: AC
  Filled 2023-01-29: qty 15

## 2023-01-29 MED ORDER — VASOPRESSIN 20 UNIT/ML IV SOLN
INTRAVENOUS | Status: AC
  Filled 2023-01-29: qty 1

## 2023-01-29 MED ORDER — IPRATROPIUM-ALBUTEROL 0.5-2.5 (3) MG/3ML IN SOLN: 3.0000 mL | RESPIRATORY_TRACT | Status: DC | PRN

## 2023-01-29 MED ORDER — ROCURONIUM BROMIDE 10 MG/ML (PF) SYRINGE
PREFILLED_SYRINGE | INTRAVENOUS | Status: DC | PRN
  Administered 2023-01-29: 50 mg via INTRAVENOUS
  Administered 2023-01-29: 30 mg via INTRAVENOUS
  Administered 2023-01-29: 20 mg via INTRAVENOUS

## 2023-01-29 MED ORDER — PHENYLEPHRINE 80 MCG/ML (10ML) SYRINGE FOR IV PUSH (FOR BLOOD PRESSURE SUPPORT)
PREFILLED_SYRINGE | INTRAVENOUS | Status: DC | PRN
  Administered 2023-01-29 (×3): 80 ug via INTRAVENOUS

## 2023-01-29 MED ORDER — BUDESON-GLYCOPYRROL-FORMOTEROL 160-9-4.8 MCG/ACT IN AERO: 2.0000 | INHALATION_SPRAY | Freq: Two times a day (BID) | RESPIRATORY_TRACT | Status: DC

## 2023-01-29 MED ORDER — CLINDAMYCIN PHOSPHATE 600 MG/50ML IV SOLN
600.0000 mg | Freq: Once | INTRAVENOUS | Status: AC
  Administered 2023-01-29: 600 mg via INTRAVENOUS
  Filled 2023-01-29 (×2): qty 50

## 2023-01-29 MED ORDER — BUDESONIDE 0.5 MG/2ML IN SUSP
0.2500 mg | Freq: Two times a day (BID) | RESPIRATORY_TRACT | Status: DC
  Administered 2023-01-30: 0.25 mg via RESPIRATORY_TRACT
  Filled 2023-01-29 (×2): qty 2

## 2023-01-29 MED ORDER — IPRATROPIUM-ALBUTEROL 0.5-2.5 (3) MG/3ML IN SOLN
3.0000 mL | Freq: Once | RESPIRATORY_TRACT | Status: AC
  Administered 2023-01-29: 3 mL via RESPIRATORY_TRACT
  Filled 2023-01-29: qty 3

## 2023-01-29 MED ORDER — HYDROMORPHONE HCL 1 MG/ML IJ SOLN
0.5000 mg | INTRAMUSCULAR | Status: DC | PRN
  Administered 2023-01-29 – 2023-01-30 (×2): 0.5 mg via INTRAVENOUS
  Filled 2023-01-29 (×2): qty 0.5

## 2023-01-29 MED ORDER — LIDOCAINE 2% (20 MG/ML) 5 ML SYRINGE
INTRAMUSCULAR | Status: DC | PRN
  Administered 2023-01-29: 60 mg via INTRAVENOUS

## 2023-01-29 SURGICAL SUPPLY — 68 items
BAG COUNTER SPONGE SURGICOUNT (BAG) ×2 IMPLANT
BAG SPNG CNTER NS LX DISP (BAG) ×1
BAR ARCH PREFORM MXLMNDB FX (Miscellaneous) ×2 IMPLANT
BAR FIX PREFORMED OMNIMAX (Miscellaneous) IMPLANT
BIT DRILL 1.6X115 (BIT) ×1
BIT DRILL 1.6X115MM (BIT) IMPLANT
BIT DRILL 1.6X50 (BIT) IMPLANT
BIT DRILL DEPTH MARK1.8X115 26 (MISCELLANEOUS) IMPLANT
BLADE MANDIBULAR SCREWDRIVER (BLADE) IMPLANT
BLADE SURG 10 STRL SS (BLADE) IMPLANT
BLADE SURG 15 STRL LF DISP TIS (BLADE) IMPLANT
BLADE SURG 15 STRL SS (BLADE)
CANISTER SUCT 3000ML PPV (MISCELLANEOUS) ×2 IMPLANT
CLEANER TIP ELECTROSURG 2X2 (MISCELLANEOUS) ×2 IMPLANT
COVER SURGICAL LIGHT HANDLE (MISCELLANEOUS) ×4 IMPLANT
DRAPE HALF SHEET 40X57 (DRAPES) IMPLANT
DRILL BIT 1.6X115MM (BIT) ×1
DRILL DEPTH MARK1.8X115 26 (MISCELLANEOUS) ×1
DRIVER SURG ZDRIVE HIGH TORQUE (ORTHOPEDIC DISPOSABLE SUPPLIES) IMPLANT
ELECT COATED BLADE 2.86 ST (ELECTRODE) IMPLANT
ELECT NDL BLADE 2-5/6 (NEEDLE) IMPLANT
ELECT NEEDLE BLADE 2-5/6 (NEEDLE) IMPLANT
ELECT REM PT RETURN 9FT ADLT (ELECTROSURGICAL) ×1
ELECTRODE REM PT RTRN 9FT ADLT (ELECTROSURGICAL) ×2 IMPLANT
GLOVE ECLIPSE 7.5 STRL STRAW (GLOVE) ×2 IMPLANT
GOWN STRL REUS W/ TWL LRG LVL3 (GOWN DISPOSABLE) ×4 IMPLANT
GOWN STRL REUS W/TWL LRG LVL3 (GOWN DISPOSABLE) ×2
HOLE ORBITAL 5 1H (Prosthesis and Implant ENT) ×1 IMPLANT
KIT BASIN OR (CUSTOM PROCEDURE TRAY) ×2 IMPLANT
KIT TURNOVER KIT B (KITS) ×2 IMPLANT
NDL HYPO 25GX1X1/2 BEV (NEEDLE) IMPLANT
NEEDLE HYPO 25GX1X1/2 BEV (NEEDLE) IMPLANT
NS IRRIG 1000ML POUR BTL (IV SOLUTION) ×2 IMPLANT
PAD ARMBOARD 7.5X6 YLW CONV (MISCELLANEOUS) ×4 IMPLANT
PATTIES SURGICAL .5 X3 (DISPOSABLE) IMPLANT
PENCIL FOOT CONTROL (ELECTRODE) ×2 IMPLANT
PLATE 1.5 4HOLE LONG STRAIGHT (Plate) IMPLANT
PLATE 16H STRAIGHT 1.0 (Plate) IMPLANT
PLATE 4H REG STRAIGHT 1.5 (Plate) IMPLANT
PLATE LOCK 7H 1.6 GOLD GRD 3 (Plate) IMPLANT
PLATE LOCK 7H CRVD 1.6 (Plate) IMPLANT
PLATE LOCK STRT 4H 1/SCREW (Plate) IMPLANT
PLATE ORBITAL 5 1 HOLE (Prosthesis and Implant ENT) IMPLANT
POSITIONER HEAD DONUT 9IN (MISCELLANEOUS) IMPLANT
PROTECTOR CORNEAL (OPHTHALMIC RELATED) IMPLANT
SCISSORS WIRE ANG 4 3/4 DISP (INSTRUMENTS) IMPLANT
SCREW BONE 2X7 CROSS DRIVE (Screw) IMPLANT
SCREW BONE MANDIB SD 2X9 (Screw) IMPLANT
SCREW HT SD X-DR 1.5X5 (Screw) IMPLANT
SCREW NLOCK LORENZ 2X5 (Screw) IMPLANT
SCREW NON LOCK HT 2.0X16 (Screw) IMPLANT
SCREW NON LOCK HT 2.3X16 (Screw) IMPLANT
SCREW NON LOCK X-DR 2.0X12 (Screw) IMPLANT
SCREW NON LOCK X-DR 2.0X14 (Screw) IMPLANT
SCREW NON LOCK X-DR 2.0X8 (Screw) IMPLANT
SCREW NON LOCK X-DR 2.3X14 (Screw) IMPLANT
SPIKE FLUID TRANSFER (MISCELLANEOUS) ×2 IMPLANT
SUT CHROMIC 3 0 PS 2 (SUTURE) ×2 IMPLANT
SUT CHROMIC 4 0 PS 2 18 (SUTURE) ×2 IMPLANT
SUT NYLON ETHILON 5-0 P-3 1X18 (SUTURE) ×2 IMPLANT
SUT SILK 2 0 PERMA HAND 18 BK (SUTURE) IMPLANT
SUT STEEL 0 (SUTURE)
SUT STEEL 0 18XMFL TIE 17 (SUTURE) IMPLANT
SUT STEEL 4 (SUTURE) ×2 IMPLANT
TOWEL GREEN STERILE FF (TOWEL DISPOSABLE) ×2 IMPLANT
TRAY ENT MC OR (CUSTOM PROCEDURE TRAY) ×2 IMPLANT
WATER STERILE IRR 1000ML POUR (IV SOLUTION) ×2 IMPLANT
WIRE 24 GAUGE OMINIMAX MMF (WIRE) IMPLANT

## 2023-01-29 NOTE — Progress Notes (Signed)
Patient and family concerned about oral medication administration due to jaw wiring. Family is concerned that they will not be able to get oral meds in.

## 2023-01-29 NOTE — Interval H&P Note (Signed)
History and Physical Interval Note:  01/29/2023 9:46 AM  Kim Higgins  has presented today for surgery, with the diagnosis of MANDIBULAR FRACTURE.  The various methods of treatment have been discussed with the patient and family. After consideration of risks, benefits and other options for treatment, the patient has consented to  Procedure(s): OPEN REDUCTION INTERNAL FIXATION (ORIF) MANDIBULAR FRACTURE (N/A) as a surgical intervention.  The patient's history has been reviewed, patient examined, no change in status, stable for surgery.  I have reviewed the patient's chart and labs.  Questions were answered to the patient's satisfaction.     Melissa Montane

## 2023-01-29 NOTE — H&P (Signed)
History and Physical   Kim Higgins [id number] DOB: 29-Jul-1946 DOA: 01/28/2023  PCP: Leeroy Cha, MD   Patient coming from: Home/OR  Chief Complaint: Fall, mandibular fracture  HPI: Kim Higgins is a 77 y.o. female with medical history significant of hypertension, CVD, PVD, GERD, low back pain presenting after fall.  Patient was walking and tripped over her shoe and landed on her knees and hands and also hit her chin on the sidewalk.  No loss of consciousness, did break several teeth, able to ambulate after.  Denies fevers, chills, chest pain, shortness of breath, abdominal pain, constipation, diarrhea, nausea, vomiting.  ED Course: Vital signs in the ED were stable with blood pressure in the 0000000 to [id number] systolic.  After mandibular fracture was repaired today by ENT has had persistent hypoxia requiring 2 2 to 3 L to maintain saturations.  Lab workup in the ED yesterday showed leukocytosis to 14.5 presumably reactive.  No other lab work done.  CT head and maxillofacial showed no intracranial abnormality but did demonstrate mandibular fractures.  CT head showed no acute normality but did show degenerative disc disease.  Right and left hand and wrist x-ray showed no acute abnormalities.  Right and left knee x-ray showed no acute normalities.  Patient received fentanyl, morphine, Ancef, Pepcid in the ED.  Received additional clindamycin by ENT.  Underwent successful ORIF of mandibular fractures today but has been persistently hypoxic following this.  Review of Systems: As per HPI otherwise all other systems reviewed and are negative.  Past Medical History:  Diagnosis Date   COPD (chronic obstructive pulmonary disease) ([place])    Hypertension    PVD (peripheral vascular disease) (HCC)    asymptomatic, mild LSCA stenosis   Tobacco abuse    discontinued    Past Surgical History:  Procedure Laterality Date   CAROTID DUPLEX  05/24/10   RIGHT & LEFT ICA=NO DIAMETER REDUCTION. ORGIN  OF LEFT CCA=EQUAL TO OR LESS THAN 70% REDUCTION. ORGIN OF LEFT SUCLAVIIAN=70-99% REDUCTION. LEFT VERTEBRAL=TO-FRO FLOW NOTED.   MYOCARDICAL PERFUSION STUDY  06/23/06   NORMAL PATTREN OF PERFUSION IN ALL REGIONS. EF% NOT ESTIMATED SECONDARY TO INABILITY TO GATE.   TRANSTHORACIC ECHOCARDIOGRAM  03/14/02   EF 60%, MILDLY THICKENED AORTIC VALVE WITH NO STENOSIS. MILD MR. OBSTRUCTION OF TRICUSPID VALVE WITH MILD TR. NORMAL RV SIZE AND FUNCTION.   UPPER ARTERIAL EXAM  05/27/11   LEFT PROXIMAL SUBCLAVIN ARTERY: SL ELEVATED VELOCITIES SUGGESTIVE OF 50% SIGNIFICANT DIAMETER REDUCTION.    Social History  reports that she quit smoking about 8 years ago. Her smoking use included cigarettes. She has never used smokeless tobacco. She reports current alcohol use of about 4.0 standard drinks of alcohol per week. She reports that she does not currently use drugs.  Allergies  Allergen Reactions   Augmentin [Amoxicillin-Pot Clavulanate] Diarrhea   Nystatin Diarrhea and Rash    Family History  Problem Relation Age of Onset   Hypertension Father    CAD Brother    Heart attack Paternal Uncle    Heart attack Paternal Grandfather   Reviewed on admission  Prior to Admission medications   Medication Sig Start Date End Date Taking? Authorizing Provider  albuterol-ipratropium (COMBIVENT) 18-103 MCG/ACT inhaler Inhale 1 puff into the lungs as needed for wheezing or shortness of breath.   Yes [provider]  ASCORBIC ACID PO Take 250 mg by mouth daily.   Yes [provider]  b complex vitamins tablet Take 1 tablet by mouth daily.  Yes [provider]  BREZTRI AEROSPHERE 160-9-4.8 MCG/ACT AERO Inhale 2 puffs into the lungs in the morning and at bedtime. 12/30/22  Yes [provider]  buPROPion (WELLBUTRIN XL) 300 MG 24 hr tablet Take 300 mg by mouth daily.   Yes [provider]  Cholecalciferol (VITAMIN D-3) 1000 UNITS CAPS Take 1 capsule by mouth daily.   Yes [provider]  clindamycin (CLEOCIN) 300 MG capsule Take 1 capsule (300 mg total) by mouth 3 (three) times daily for 10 days. 01/29/23 02/08/23 Yes Melissa Montane, MD  famotidine (PEPCID) 20 MG tablet Take 20 mg by mouth at bedtime.   Yes [provider]  HYDROcodone-acetaminophen (NORCO/VICODIN) 5-325 MG tablet Take 1 tablet by mouth every 6 (six) hours as needed for moderate pain. 01/29/23  Yes Melissa Montane, MD  irbesartan (AVAPRO) 150 MG tablet TAKE 1 TABLET(150 MG) BY MOUTH DAILY Patient taking differently: Take 300 mg by mouth daily. 07/13/20  Yes Lorretta Harp, MD  MAGNESIUM CITRATE PO Take 1 capsule by mouth daily. for RLL   Yes [provider]  Probiotic CHEW Chew 2 tablets by mouth daily.   Yes [provider]  TURMERIC-GINGER PO Take 1 tablet by mouth daily.   Yes [provider]    Physical Exam: Vitals:   01/29/23 1300 01/29/23 1315 01/29/23 1330 01/29/23 1345  BP: 126/74 (!) 133/58 126/68 (!) 113/90  Pulse: 88 90 91 92  Resp: 15 14 14 19   Temp:      TempSrc:      SpO2: 96% 94% 92% 92%  Height:        Physical Exam Constitutional:      General: She is not in acute distress.    Appearance: Normal appearance.  HENT:     Head: Normocephalic and atraumatic.     Mouth/Throat:     Mouth: Mucous membranes are moist.     Comments: Jaw wired shut s/p mandibular ORIF Bloody secretions requiring suction Bruised chin s/p laceration repair Eyes:     Extraocular Movements: Extraocular movements intact.     Pupils: Pupils are equal, round, and reactive to light.  Cardiovascular:     Rate and Rhythm: Normal rate and regular rhythm.     Pulses: Normal pulses.     Heart sounds: Normal heart sounds.  Pulmonary:     Effort: Pulmonary effort is normal. No respiratory distress.     Breath sounds: Rales (Trace, bilateral base) present.  Abdominal:     General: Bowel sounds are normal. There is no distension.     Palpations: Abdomen is soft.      Tenderness: There is no abdominal tenderness.  Musculoskeletal:        General: No swelling or deformity.  Skin:    General: Skin is warm and dry.  Neurological:     General: No focal deficit present.     Mental Status: Mental status is at baseline.    Labs on Admission: I have personally reviewed following labs and imaging studies  CBC: Recent Labs  Lab 01/29/23 0620  WBC 14.5*  HGB 12.2  HCT 36.4  MCV 97.8  PLT 0000000    Basic Metabolic Panel: No results for input(s): "NA", "K", "CL", "CO2", "GLUCOSE", "BUN", "CREATININE", "CALCIUM", "MG", "PHOS" in the last 168 hours.  GFR: CrCl cannot be calculated (Patient's most recent lab result is older than the maximum 21 days allowed.).  Liver Function Tests: No results for input(s): "AST", "ALT", "ALKPHOS", "BILITOT", "PROT", "  ALBUMIN" in the last 168 hours.  Urine analysis: No results found for: "COLORURINE", "APPEARANCEUR", "LABSPEC", "PHURINE", "GLUCOSEU", "HGBUR", "BILIRUBINUR", "KETONESUR", "PROTEINUR", "UROBILINOGEN", "NITRITE", "LEUKOCYTESUR"  Radiological Exams on Admission: CT Head Wo Contrast  Result Date: 01/28/2023 CLINICAL DATA:  Fall, broken teeth, concern for mandibular fracture EXAM: CT HEAD WITHOUT CONTRAST CT MAXILLOFACIAL WITHOUT CONTRAST TECHNIQUE: Multidetector CT imaging of the head and maxillofacial structures were performed using the standard protocol without intravenous contrast. Multiplanar CT image reconstructions of the maxillofacial structures were also generated. RADIATION DOSE REDUCTION: This exam was performed according to the departmental dose-optimization program which includes automated exposure control, adjustment of the mA and/or kV according to patient size and/or use of iterative reconstruction technique. COMPARISON:  None Available. FINDINGS: CT HEAD FINDINGS Brain: No evidence of acute infarction, hemorrhage, hydrocephalus, extra-axial collection or mass lesion/mass effect. Mild periventricular  white matter hypodensity. Vascular: No hyperdense vessel or unexpected calcification. CT FACIAL BONES FINDINGS Skull: Normal. Negative for fracture or focal lesion. Mandible: Comminuted, minimally displaced fractures of the mandibular body (series 4, image 15). Additional nondisplaced fractures of the right mandibular condyle and head, extending into the temporomandibular joint, without dislocation of the joint (series 4, image 40). Facial bones: No displaced fractures or dislocations of the facial bones proper. Sinuses/Orbits: No acute finding. Other: Soft tissue laceration of the chin. IMPRESSION: 1. No acute intracranial pathology. Small-vessel white matter disease. 2. Comminuted, minimally displaced fractures of the mandibular body. Additional nondisplaced fractures of the right mandibular condyle and head, extending into the temporomandibular joint, without dislocation of the joint. 3. No displaced fractures or dislocations of the facial bones proper. 4. Soft tissue laceration of the chin. Electronically Signed   By: Delanna Ahmadi M.D.   On: 01/28/2023 16:01   CT Maxillofacial Wo Contrast  Result Date: 01/28/2023 CLINICAL DATA:  Fall, broken teeth, concern for mandibular fracture EXAM: CT HEAD WITHOUT CONTRAST CT MAXILLOFACIAL WITHOUT CONTRAST TECHNIQUE: Multidetector CT imaging of the head and maxillofacial structures were performed using the standard protocol without intravenous contrast. Multiplanar CT image reconstructions of the maxillofacial structures were also generated. RADIATION DOSE REDUCTION: This exam was performed according to the departmental dose-optimization program which includes automated exposure control, adjustment of the mA and/or kV according to patient size and/or use of iterative reconstruction technique. COMPARISON:  None Available. FINDINGS: CT HEAD FINDINGS Brain: No evidence of acute infarction, hemorrhage, hydrocephalus, extra-axial collection or mass lesion/mass effect. Mild  periventricular white matter hypodensity. Vascular: No hyperdense vessel or unexpected calcification. CT FACIAL BONES FINDINGS Skull: Normal. Negative for fracture or focal lesion. Mandible: Comminuted, minimally displaced fractures of the mandibular body (series 4, image 15). Additional nondisplaced fractures of the right mandibular condyle and head, extending into the temporomandibular joint, without dislocation of the joint (series 4, image 40). Facial bones: No displaced fractures or dislocations of the facial bones proper. Sinuses/Orbits: No acute finding. Other: Soft tissue laceration of the chin. IMPRESSION: 1. No acute intracranial pathology. Small-vessel white matter disease. 2. Comminuted, minimally displaced fractures of the mandibular body. Additional nondisplaced fractures of the right mandibular condyle and head, extending into the temporomandibular joint, without dislocation of the joint. 3. No displaced fractures or dislocations of the facial bones proper. 4. Soft tissue laceration of the chin. Electronically Signed   By: Delanna Ahmadi M.D.   On: 01/28/2023 16:01   CT Cervical Spine Wo Contrast  Result Date: 01/28/2023 CLINICAL DATA:  Neck trauma (Age >= 65y) EXAM: CT CERVICAL SPINE WITHOUT CONTRAST TECHNIQUE: Multidetector CT imaging of  the cervical spine was performed without intravenous contrast. Multiplanar CT image reconstructions were also generated. RADIATION DOSE REDUCTION: This exam was performed according to the departmental dose-optimization program which includes automated exposure control, adjustment of the mA and/or kV according to patient size and/or use of iterative reconstruction technique. COMPARISON:  None Available. FINDINGS: Alignment: Straightening of the cervical lordosis with slight reversal likely due to patient positioning. There is grade 1 anterolisthesis at C3-C4. grade 1 degenerative anterolisthesis at C7-T1. Skull base and vertebrae: There is no evidence of acute  cervical spine fracture. There is a minimally displaced fracture of the right mandibular condyle. Soft tissues and spinal canal: No prevertebral fluid or swelling. No visible canal hematoma. Disc levels: There is severe degenerative disc disease from C4 through C7. Multilevel facet arthropathy, severe on the left at C3-C4 and on the right at C2-C3 and C7-T1. There is at least mild to moderate bilateral neural foraminal narrowing at C4-C5, C5-C6, and C6-C7. Upper chest: Negative. Other: Vascular calcifications. Apical predominant centrilobular emphysema. IMPRESSION: No evidence acute cervical spine fracture. Partially visualized minimally displaced fracture of the right mandibular condyle. Severe degenerative disc disease from C4 through C7 with bilateral neural foraminal narrowing at these levels. Multilevel facet arthropathy, severe on the left at C3-C4 and on the right at C2-C3 and C7-T1. Electronically Signed   By: Maurine Simmering M.D.   On: 01/28/2023 15:59   DG Hand Complete Right  Result Date: 01/28/2023 CLINICAL DATA:  [employer] EXAM: RIGHT HAND - COMPLETE 3+ VIEW COMPARISON:  None Available. FINDINGS: There is no evidence of acute fracture. 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 There is cystic change in the lunate. IMPRESSION: No acute osseous abnormality. Severe thumb CMC joint osteoarthritis. Electronically Signed   By: Maurine Simmering M.D.   On: 01/28/2023 15:51   DG Hand Complete Left  Result Date: 01/28/2023 CLINICAL DATA:  [place] EXAM: LEFT HAND - COMPLETE 3+ VIEW COMPARISON:  None Available. FINDINGS: There is no evidence of acute fracture. 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 There is no bone erosion or periostitis. Soft tissues are unremarkable. IMPRESSION: No acute osseous abnormality. Severe thumb CMC joint osteoarthritis. Electronically Signed   By: Maurine Simmering M.D.   On: 01/28/2023 15:50   DG Wrist Complete Left  Result Date: 01/28/2023 CLINICAL DATA:  Trauma, fall, pain  EXAM: LEFT WRIST - COMPLETE 3+ VIEW COMPARISON:  01/28/2023 FINDINGS: Bones are osteopenic and advanced degenerative arthropathy of the left wrist first [facility] joint with joint space loss, sclerosis and bony spurring of the trapezium and first metacarpal articulation. No acute osseous finding, fracture or malalignment. Distal radius, ulna and carpal bones appear intact. No focal soft tissue abnormality. IMPRESSION: Osteopenia and degenerative changes as above. No acute osseous finding by plain radiography. Electronically Signed   By: Jerilynn Mages.  Shick M.D.   On: 01/28/2023 15:37   DG Wrist Complete Right  Result Date: 01/28/2023 CLINICAL DATA:  Fall, trauma, pain EXAM: RIGHT WRIST - COMPLETE 3+ VIEW COMPARISON:  01/28/2023 FINDINGS: Bones are osteopenic. Distal radius, ulna, and carpal bones appear intact. No acute osseous finding or fracture. No malalignment. Degenerative osteoarthritis of the right wrist first [place] joint with joint space loss, sclerosis and bony spurring involving the trapezium and first metatarsal articulation. Benign cystic change noted in the lunate. No focal soft tissue abnormality or swelling. IMPRESSION: Osteopenia and degenerative changes as above. No acute osseous finding by plain radiography. Electronically Signed   By: Jerilynn Mages.  Shick M.D.   On:  01/28/2023 15:36   DG Knee Complete 4 Views Right  Result Date: 01/28/2023 CLINICAL DATA:  Fall, trauma, pain EXAM: RIGHT KNEE - COMPLETE 4+ VIEW COMPARISON:  01/28/2023 FINDINGS: No evidence of fracture, dislocation, or joint effusion. No evidence of arthropathy or other focal bone abnormality. Soft tissues are unremarkable. IMPRESSION: Negative. Electronically Signed   By: Jerilynn Mages.  Shick M.D.   On: 01/28/2023 15:34   DG Knee Complete 4 Views Left  Result Date: 01/28/2023 CLINICAL DATA:  Fall, trauma, pain EXAM: LEFT KNEE - COMPLETE 4+ VIEW COMPARISON:  None Available. FINDINGS: No evidence of fracture, dislocation, or joint effusion. No evidence of  arthropathy or other focal bone abnormality. Soft tissues are unremarkable. IMPRESSION: No acute osseous finding Electronically Signed   By: Jerilynn Mages.  Shick M.D.   On: 01/28/2023 15:31    EKG: Not performed this admission  Assessment/Plan Principal Problem:   Acute respiratory failure with hypoxia (HCC) Active Problems:   HTN (hypertension)   PVD (peripheral vascular disease)- 50% LSCA stenosis by doppler 2012,   Chronic obstructive lung disease ([place])   Gastroesophageal reflux disease   Fall at home, initial encounter   Multiple closed mandibular fractures ([place])   COPD Acute respiratory failure with hypoxia > Patient is having some continued respiratory failure with hypoxia postoperatively.  Spoke with ENT who stated for him and the anesthesia provider this is significantly longer than I would typically take patient to return to baseline to be discharged home. > Does have known history of COPD will start with chest x-ray and nebulizer treatment.  Given she had a significant fall will also use chest x-ray to evaluate for any possible pneumothorax. > Suspect that this is primarily due to decreased reserve in setting of COPD and being unable to get full breaths due to her jaw being wired shut and ongoing pain postoperatively.  Will be unable to perform incentive spirometry as well. > On exam no wheezing appreciated status post neb, trace crackles bilateral bases. - Monitor on telemetry overnight - Continue supple oxygen, wean as tolerated - Chest x-ray - DuoNeb now then as needed - Continue home Judithann Sauger - Will get basic labs with CMP and CBC  Fall Mandibular fractures > Fall where she tripped and hit her hands knees and chin.  X-ray of hands, wrists, knees without acute abnormality.  CT of head and C-spine negative other than mandibular fracture. > Status post ORIF repair of mandibular fracture.  Jaw wired shut. > Some continued bloody secretions/oozing from mouth. - Appreciate input from  ENT - Nutrition consult - As needed pain control  Hypertension - Holding home irbesartan in the setting of low normal blood pressure thus far.  GERD - Continue home Pepcid  DVT prophylaxis: SCDs for now as she is postop Code Status:   Full Family Communication:  Updated at bedside  Disposition Plan:   Patient is from:  Home  Anticipated DC to:  Home  Anticipated DC date:  1 to 2 days  Anticipated DC barriers: None  Consults called:  None, recently underwent surgical intervention of mandibular fracture by ENT, Dr. Janace Hoard. Admission status:  Observation, telemetry  Severity of Illness: The appropriate patient status for this patient is OBSERVATION. Observation status is judged to be reasonable and necessary in order to provide the required intensity of service to ensure the patient's safety. The patient's presenting symptoms, physical exam findings, and initial radiographic and laboratory data in the context of their medical condition is felt to place them at decreased  risk for further clinical deterioration. Furthermore, it is anticipated that the patient will be medically stable for discharge from the hospital within 2 midnights of admission.    Marcelyn Bruins MD [facility]  How to contact the [facility] Attending or Consulting provider [place] or covering provider during after hours [place], for this patient?   Check the care team in [facility] and look for a) attending/consulting [facility] provider listed and b) the [facility] team listed Log into [email or web address] and use [place]'s universal password to access. If you do not have the password, please contact the hospital operator. Locate the [facility] provider you are looking for under [facility] and page to a number that you can be directly reached. If you still have difficulty reaching the provider, please page the [facility] (Director on Call) for the Hospitalists listed on amion for assistance.  01/29/2023, 3:11 PM

## 2023-01-29 NOTE — Progress Notes (Signed)
PT. ADMITTED FOR OVERNIGHT OBSERVATION DUE TO REQUIRING SUPPLEMENTAL O2 TO MAINTAIN APPROPRIATE OXYGENATION LEVELS.

## 2023-01-29 NOTE — Op Note (Signed)
Preop/postop diagnosis: Mandible fracture complex Procedure: Open reduction internal fixation of mandible fracture and maxillary mandibular fixation Anesthesia: General Estimated blood loss: Approximately 50 cc Indications: This is a 77 year old with a fall hitting her chin and having a complex comminuted mandible fracture of the anterior region and a right temporomandibular joint located condyle fracture.  She was informed that her occlusion will never be well and she already has very poor dentition and many many missing teeth.  She is informed of the risks, benefits, and options.  All her questions were answered and consent was obtained. Procedure: Patient was taken the op room placed supine position after general nasal endotracheal tube anesthesia was placed in the supine position prepped and draped in the usual sterile manner.  The occlusion was fitted and it did come back into position which is what appears to be her occlusion.  It looks like the lower incisors were chipped so that her shoulder.  Arch bars were placed on the maxilla and then the mandible and this was with 4 screws in each bar.  Wires were placed to fit her into occlusion.  An incision was made in the gingivolabial sulcus line inferiorly and dissection was carried down to the bone.  Laterally the dissection was carried to the nerve which was identified and preserved.  The right nerve was also identified and preserved.  The fracture was very comminuted.  There was multiple pieces that were either on and aligned are slightly separated and the middle 1 was slightly anterior displaced.  These were position back into their anatomic position.  A banding plate was placed on the left side to secure that fracture to solid bone.  The bone in multiple places did fracture easily with very gentle manipulation.  A plate was then placed on the right side to band the fractures.  Again the bone was very weak but the plate was positioned laterally of the  fracture with 3 good screws a good screw to hold the middle piece and then screws on the left lateral aspect past the fracture that were solid bone.  This was all performed under the nerve for that plate as there was not enough room with a banding plate above the nerve on the left.  Once this was positioned the 2.0 mandible plate was then positioned and bent to span on the inferior rim from fracture line to fracture line.  This was secured with 12 screws and drilling the holes again preserving the marginal nerve carefully avoiding it with gentle retraction.  This seemed to solidify the mandible fractures.  The wound was then closed after irrigating with saline with running locking 3-0 chromic.  The arch bars were secured and 3 wires were placed as the arch bars were shorter because she had so few teeth for a longer arch bar.  3 wires secured the occlusion nicely.  She had the pharynx and hypopharynx.  Soffa get suction out the NG tube.  There was good hemostasis.  The patient was then awakened brought to recovery in stable condition counts correct.

## 2023-01-29 NOTE — Transfer of Care (Signed)
Immediate Anesthesia Transfer of Care Note  Patient: Kim Higgins  Procedure(s) Performed: OPEN REDUCTION INTERNAL FIXATION (ORIF) MANDIBULAR FRACTURE  Patient Location: PACU  Anesthesia Type:General  Level of Consciousness: awake, alert , and oriented  Airway & Oxygen Therapy: Patient connected to nasal cannula oxygen  Post-op Assessment: Post -op Vital signs reviewed and stable  Post vital signs: stable  Last Vitals:  Vitals Value Taken Time  BP 127/50 01/29/23 1245  Temp    Pulse 98 01/29/23 1249  Resp 15 01/29/23 1249  SpO2 91 % 01/29/23 1249  Vitals shown include unvalidated device data.  Last Pain:  Vitals:   01/29/23 0844  TempSrc: Oral  PainSc: 0-No pain         Complications: No notable events documented.

## 2023-01-29 NOTE — Anesthesia Procedure Notes (Addendum)
Procedure Name: Intubation Date/Time: 01/29/2023 9:58 AM  Performed by: Lavell Luster, CRNAPre-anesthesia Checklist: Patient identified, Emergency Drugs available, Suction available, Patient being monitored and Timeout performed Patient Re-evaluated:Patient Re-evaluated prior to induction Oxygen Delivery Method: Circle system utilized Preoxygenation: Pre-oxygenation with 100% oxygen Induction Type: IV induction Ventilation: Mask ventilation without difficulty Laryngoscope Size: Mac and 3 Grade View: Grade I Nasal Tubes: Right and Magill forceps- large, utilized Tube size: 7.0 mm Number of attempts: 1 Placement Confirmation: ETT inserted through vocal cords under direct vision, positive ETCO2 and breath sounds checked- equal and bilateral Secured at: 21 cm Tube secured with: Tape Dental Injury: Teeth and Oropharynx as per pre-operative assessment

## 2023-01-29 NOTE — Interval H&P Note (Signed)
History and Physical Interval Note:  01/29/2023 9:45 AM  Kim Higgins  has presented today for surgery, with the diagnosis of MANDIBULAR FRACTURE.  The various methods of treatment have been discussed with the patient and family. After consideration of risks, benefits and other options for treatment, the patient has consented to  Procedure(s): OPEN REDUCTION INTERNAL FIXATION (ORIF) MANDIBULAR FRACTURE (N/A) as a surgical intervention.  The patient's history has been reviewed, patient examined, no change in status, stable for surgery.  I have reviewed the patient's chart and labs.  Questions were answered to the patient's satisfaction.     Melissa Montane

## 2023-01-29 NOTE — Anesthesia Preprocedure Evaluation (Addendum)
Anesthesia Evaluation  Patient identified by MRN, date of birth, ID band Patient awake    Reviewed: Allergy & Precautions, NPO status , Patient's Chart, lab work & pertinent test results  History of Anesthesia Complications Negative for: history of anesthetic complications  Airway Mallampati: IV  TM Distance: >3 FB   Mouth opening: Limited Mouth Opening  Dental  (+) Dental Advisory Given   Pulmonary neg shortness of breath, neg sleep apnea, COPD,  COPD inhaler, neg recent URI, former smoker   breath sounds clear to auscultation       Cardiovascular hypertension, Pt. on medications (-) angina (-) Past MI and (-) CHF  Rhythm:Regular     Neuro/Psych negative neurological ROS  negative psych ROS   GI/Hepatic Neg liver ROS,,,  Endo/Other  negative endocrine ROS    Renal/GU negative Renal ROS     Musculoskeletal negative musculoskeletal ROS (+)    Abdominal   Peds  Hematology negative hematology ROS (+)   Anesthesia Other Findings   Reproductive/Obstetrics                             Anesthesia Physical Anesthesia Plan  ASA: 2  Anesthesia Plan: General   Post-op Pain Management: Ofirmev IV (intra-op)*   Induction: Intravenous  PONV Risk Score and Plan: 3 and Ondansetron and Dexamethasone  Airway Management Planned: Nasal ETT  Additional Equipment: None  Intra-op Plan:   Post-operative Plan: Extubation in OR  Informed Consent: I have reviewed the patients History and Physical, chart, labs and discussed the procedure including the risks, benefits and alternatives for the proposed anesthesia with the patient or authorized representative who has indicated his/her understanding and acceptance.     Dental advisory given  Plan Discussed with: CRNA  Anesthesia Plan Comments:         Anesthesia Quick Evaluation

## 2023-01-30 DIAGNOSIS — J9601 Acute respiratory failure with hypoxia: Secondary | ICD-10-CM | POA: Diagnosis not present

## 2023-01-30 DIAGNOSIS — S02601A Fracture of unspecified part of body of right mandible, initial encounter for closed fracture: Secondary | ICD-10-CM | POA: Diagnosis not present

## 2023-01-30 LAB — CBC
HCT: 36.6 % (ref 36.0–46.0)
Hemoglobin: 11.6 g/dL — ABNORMAL LOW (ref 12.0–15.0)
MCH: 31.9 pg (ref 26.0–34.0)
MCHC: 31.7 g/dL (ref 30.0–36.0)
MCV: 100.5 fL — ABNORMAL HIGH (ref 80.0–100.0)
Platelets: 197 10*3/uL (ref 150–400)
RBC: 3.64 MIL/uL — ABNORMAL LOW (ref 3.87–5.11)
RDW: 14.6 % (ref 11.5–15.5)
WBC: 13.3 10*3/uL — ABNORMAL HIGH (ref 4.0–10.5)
nRBC: 0 % (ref 0.0–0.2)

## 2023-01-30 LAB — COMPREHENSIVE METABOLIC PANEL
ALT: 22 U/L (ref 0–44)
AST: 31 U/L (ref 15–41)
Albumin: 2.9 g/dL — ABNORMAL LOW (ref 3.5–5.0)
Alkaline Phosphatase: 60 U/L (ref 38–126)
Anion gap: 8 (ref 5–15)
BUN: 16 mg/dL (ref 8–23)
CO2: 25 mmol/L (ref 22–32)
Calcium: 8.9 mg/dL (ref 8.9–10.3)
Chloride: 102 mmol/L (ref 98–111)
Creatinine, Ser: 0.8 mg/dL (ref 0.44–1.00)
GFR, Estimated: >60 mL/min (ref >60)
Glucose, Bld: 103 mg/dL — ABNORMAL HIGH (ref 70–99)
Potassium: 4.6 mmol/L (ref 3.5–5.1)
Sodium: 135 mmol/L (ref 135–145)
Total Bilirubin: 1 mg/dL (ref 0.3–1.2)
Total Protein: 5.5 g/dL — ABNORMAL LOW (ref 6.5–8.1)

## 2023-01-30 MED ORDER — ENSURE ENLIVE PO LIQD
237.0000 mL | Freq: Three times a day (TID) | ORAL | Status: DC
  Administered 2023-01-30: 237 mL via ORAL

## 2023-01-30 MED ORDER — CLINDAMYCIN PHOSPHATE 600 MG/50ML IV SOLN
600.0000 mg | Freq: Four times a day (QID) | INTRAVENOUS | Status: DC
  Administered 2023-01-30: 600 mg via INTRAVENOUS
  Filled 2023-01-30 (×4): qty 50

## 2023-01-30 MED ORDER — SODIUM CHLORIDE 0.9 % IV BOLUS
250.0000 mL | Freq: Once | INTRAVENOUS | Status: AC
  Administered 2023-01-30: 250 mL via INTRAVENOUS

## 2023-01-30 MED ORDER — HYDROCODONE-ACETAMINOPHEN 5-325 MG PO TABS: 1.0000 | ORAL_TABLET | Freq: Four times a day (QID) | ORAL | 0 refills | Status: DC | PRN

## 2023-01-30 NOTE — Anesthesia Postprocedure Evaluation (Signed)
Anesthesia Post Note  Patient: Kim Higgins  Procedure(s) Performed: OPEN REDUCTION INTERNAL FIXATION (ORIF) MANDIBULAR FRACTURE     Patient location during evaluation: PACU Anesthesia Type: General Level of consciousness: awake and alert Pain management: pain level controlled Vital Signs Assessment: post-procedure vital signs reviewed and stable Respiratory status: spontaneous breathing, nonlabored ventilation, respiratory function stable and patient connected to nasal cannula oxygen Cardiovascular status: blood pressure returned to baseline and stable Postop Assessment: no apparent nausea or vomiting Anesthetic complications: no  No notable events documented.  Last Vitals:  Vitals:   01/30/23 0753 01/30/23 1647  BP: (!) 98/47 (!) 95/59  Pulse: 81 84  Resp: 16 16  Temp: (!) 36.3 C 36.7 C  SpO2: 97% 98%    Last Pain:  Vitals:   01/30/23 1647  TempSrc: Oral  PainSc:                  Jaggar Benko

## 2023-01-30 NOTE — Progress Notes (Signed)
Nurse requested Mobility Specialist to perform oxygen saturation test with pt which includes removing pt from oxygen both at rest and while ambulating.  Below are the results from that testing.     Patient Saturations on Room Air at Rest = spO2 94%  Patient Saturations on Room Air while Ambulating = sp02 84% .    Patient Saturations on 2 Liters of oxygen while Ambulating = sp02 92%  At end of testing pt left in room on 1  Liters of oxygen.  Reported results to nurse.

## 2023-01-30 NOTE — Progress Notes (Signed)
Initial Nutrition Assessment  DOCUMENTATION CODES:   Not applicable  INTERVENTION:   Ensure Enlive po TID, each supplement provides 350 kcal and 20 grams of protein. Diet education   NUTRITION DIAGNOSIS:   Increased nutrient needs related to post-op healing as evidenced by estimated needs.  GOAL:   Patient will meet greater than or equal to 90% of their needs  MONITOR:   PO intake, Supplement acceptance, Labs, Weight trends, Skin  REASON FOR ASSESSMENT:   Consult Other (Comment) (Wired Jaw)  ASSESSMENT:     77 y.o. female presented to the ED after a fall, hitting her chin. PMH includes GERD, PVD, HTN,  COPD. Pt admitted post-op with acute respiratory failure with hypoxia.   3/17 - OR s/p open reduction internal fixation of mandible fracture and maxillary mandibular fixation  Pt reports that her appetite was well at home, didn't eat much though. Reports that she was drinking Ensure's at home per doctor recommendations due to not eating enough protein. Hasn't ate much, but drank an Ensure this morning. Not ready to eat too much.  RD provided pt with "Jaw Wired Nutrition Therapy" handout. Discussed that pt should consume liquids that can be consumed via straw or syringe. Recommend utilizing Ensure's during healing process to meet calorie and protein needs. Provided pt with coupon's for Ensure.  Medications reviewed and include: Pepcid,  Labs reviewed: Sodium 135, Potassium 4.6   NUTRITION - FOCUSED PHYSICAL EXAM:  Deferred to follow-up.   Diet Order:   Diet Order             Diet full liquid Room service appropriate? Yes; Fluid consistency: Thin  Diet effective now           Diet - low sodium heart healthy                   EDUCATION NEEDS:   Education needs have been addressed  Skin:  Skin Assessment: Reviewed RN Assessment  Last BM:  3/16  Height:  Ht Readings from Last 1 Encounters:  01/28/23 5\' 3"  (1.6 m)   Weight:  Wt Readings from Last 1  Encounters:  09/02/22 51.7 kg   BMI:  Body mass index is 20.19 kg/m.  Estimated Nutritional Needs:  Kcal:  1600-1800 Protein:  70-90 grams Fluid:  >/= 1.6 L   Kim Higgins RD, LDN Clinical Dietitian See [facility] for contact information.

## 2023-01-30 NOTE — Progress Notes (Signed)
Patient ID: Kim Higgins, female   DOB: June 22, 1946, 77 y.o.   MRN: [id number]    She is swollen as expected. She has good occlusion and the wires are tight. The wound looks good. She should  be on clindamycin 600mg  IV q 6 while in hospital . She can have full liguid diet now. She was given a script for hydrocodone yesterday. She had clindamycin called in to [facility] yesterday. She should follow up with me in 2 weeks [phone], call [phone] for any issues or questions.

## 2023-01-30 NOTE — TOC CAGE-AID Note (Signed)
Transition of Care [facility]) - CAGE-AID Screening   Patient Details  Name: Kim Higgins MRN: [id number] Date of Birth: Nov 06, 1946  [facility]) CM/SW Contact:    Trudee Kuster, RN Phone Number: 01/30/2023, 2:01 AM   Clinical Narrative: Patient endorses 4 standard alcohol drinks per week, denies use of illicit drugs. Education not offered at this time.   CAGE-AID Screening:    Have You Ever Felt You Ought to Cut Down on Your Drinking or Drug Use?: No Have People Annoyed You By Critizing Your Drinking Or Drug Use?: No Have You Felt Bad Or Guilty About Your Drinking Or Drug Use?: No Have You Ever Had a Drink or Used Drugs First Thing In The Morning to Steady Your Nerves or to Get Rid of a Hangover?: No CAGE-AID Score: 0  Substance Abuse Education Offered: No

## 2023-01-30 NOTE — TOC Initial Note (Signed)
Transition of Care (TOC) - Initial/Assessment Note   Spoke to patient at bedside. Updated face sheet information.   Patient from home alone. Her brother came in town to stay with her. She will arrange caregivers through [place].   Brother will provide transportation home today.   Patient requiring home oxygen. No preference in DME agency . Home oxygen ordered through [place] [facility]. Portable oxygen will be delivered to room prior to discharge . [employer] will arrange a time to deliver home DME with patient.   Patient voiced understanding  Patient Details  Name: Kim Higgins MRN: [id number] Date of Birth: December 03, 1945  Transition of Care [facility]) CM/SW Contact:    Marilu Favre, RN Phone Number: 01/30/2023, 4:09 PM  Clinical Narrative:                   Expected Discharge Plan: Home/Self Care Barriers to Discharge: No Barriers Identified   Patient Goals and CMS Choice Patient states their goals for this hospitalization and ongoing recovery are:: to return to home CMS Medicare.gov Compare Post Acute Care list provided to:: Patient Choice offered to / list presented to : Patient [place] ownership interest in [facility].provided to:: Patient    Expected Discharge Plan and Services   Discharge Planning Services: CM Consult Post Acute Care Choice: Durable Medical Equipment Living arrangements for the past 2 months: Single Family Home Expected Discharge Date: 01/30/23               DME Arranged: Oxygen DME Agency: [employer] Date DME Agency Contacted: 01/30/23 Time DME Agency Contacted: [phone] Representative spoke with at DME Agency: [facility] HH Arranged: NA          Prior Living Arrangements/Services Living arrangements for the past 2 months: [place] Lives with:: Self Patient language and need for interpreter reviewed:: Yes Do you feel safe going back to the place where you live?: Yes      Need for Family Participation in Patient Care: Yes  (Comment) Care giver support system in place?: Yes (comment)   Criminal Activity/Legal Involvement Pertinent to Current Situation/Hospitalization: No - Comment as needed  Activities of Daily Living      Permission Sought/Granted   Permission granted to share information with : No              Emotional Assessment Appearance:: Appears stated age Attitude/Demeanor/Rapport: Engaged Affect (typically observed): Accepting Orientation: : Oriented to Self, Oriented to Place, Oriented to  Time, Oriented to Situation Alcohol / Substance Use: Not Applicable Psych Involvement: No (comment)  Admission diagnosis:  Abrasions of multiple sites [T07.XXXA] Closed fracture of right condylar process of mandible, initial encounter ([place]) [S02.611A] Open fracture of right side of mandibular body, initial encounter ([place]) [S02.601B] Acute respiratory failure with hypoxia ([place]) [J96.01] Patient Active Problem List   Diagnosis Date Noted   Gastroesophageal reflux disease 01/29/2023   Acute respiratory failure with hypoxia ([place]) 01/29/2023   Fall at home, initial encounter 01/29/2023   Multiple closed mandibular fractures ([place]) 01/29/2023   Chronic obstructive lung disease ([place]) 10/11/2022   Low back pain 12/25/2019   Dizziness 04/20/2018   HTN (hypertension) 07/16/2014   PVD (peripheral vascular disease)- 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

## 2023-01-30 NOTE — Discharge Summary (Signed)
Physician Discharge Summary  Kim Higgins [id number] DOB: May 12, 1946 DOA: 01/28/2023  PCP: Leeroy Cha, MD  Admit date: 01/28/2023 Discharge date: 01/30/2023  Admitted From: home Discharge disposition: home  Recommendations at discharge:  Keep irbesartan on hold because of low blood pressure.  Continue to monitor blood pressure at home.  If systolic blood pressure is over 140, restart irbesartan. Antibiotics as recommended by ENT Follow-up with ENT as an outpatient.  Brief narrative: Kim Higgins is a 77 y.o. female with PMH significant for HTN, PAD, COPD, GERD, low back pain 3/16, patient presented to the ED after a fall when she tripped over her shoe on a sidewalk, fell forward and landed on her knees, hands and also hit her chin breaking several of her teeth.  She did not pass out and was able to get up and ambulate after that.  In the ED, hemodynamically stable Labs with WC count 14.5 CT head and CT cervical spine did not show intracranial injury.  CT maxillofacial showed comminuted, minimally displaced fractures of the mandibular body, nondisplaced fractures of the right mandibular condyle and head extending into the temporomandibular joint without dislocation of the joint.   X-rays of right hand, left, right knee, left knee were unremarkable for fracture ENT Dr. Janace Hoard was consulted 3/17, she underwent successful ORIF of mandibular fractures Postprocedure, patient was persistently hypoxic requiring 2 to 3 L of oxygen.  Chest x-ray was unremarkable Admitted to [facility]  Subjective: Patient was seen and examined this afternoon.  Pleasant elderly Caucasian female.  Walking on the hallway with mobility specialist with a walker.  Requiring 2 L oxygen by nasal cannula. Has problems opening her jaw and drooling but because of missing teeth, she has space to pass food through Chart reviewed Overnight, afebrile, heart rate in 80s, blood pressure in 90s and 100s this  morning. Remains on 2 L oxygen. Last set of blood work from this morning showed WC count 13.3  Hospital course: Mandibular fracture Secondary to a fall Underwent ORIF by ENT Dr. Janace Hoard on 3/17 ENT follow-up appreciated. On exam this morning, she has swelling as expected.  She has good occlusion and the wires are tight. Has problems opening her jaw and drooling but because of missing teeth, she has space to pass food through. Currently on clindamycin 60 mg IV every 6 hours.  Plan is to continue clindamycin post discharge.  Prescription sent to pharmacy by ENT Dr. Janace Hoard She will remain on soft diet till she follows up with ENT as an outpatient. Pain control with Norco as needed  Acute respiratory failure with hypoxia COPD 3/17, postprocedure, patient was hypoxic requiring 2 to 3 L of oxygen.  She has underlying COPD. Chest x-ray was unremarkable. Currently on nebs, low-flow supplemental oxygen. Requiring 2 L oxygen by nasal cannula on ambulation.  Home oxygen arranged  Recent Labs  Lab 01/29/23 0620 01/29/23 1528 01/30/23 0339  WBC 14.5* 13.8* 13.3*   Hypotension History of hypertension PTA on irbesartan 300 mg daily Blood pressure this morning in low 100s and 90s.  Recommend to hold irbesartan for next 1 to 2 days.  Continue to monitor blood pressure at home.  Resume it once systolic blood pressure is over 140.   GERD Continue home Pepcid  Depression Bupropion 300 mg daily   Mobility: Encourage ambulation  Goals of care   Code Status: Full Code   Wounds:  - Incision (Closed) 01/29/23 Face (Active)  Date First Assessed/Time First Assessed: 01/29/23 1238  Location: Face    Assessments 01/29/2023 12:45 PM 01/30/2023  8:31 AM  Dressing Type None None  Dressing Clean, Dry, Intact --  Site / Wound Assessment Clean;Dry Purple;Red  Closure -- Sutures  Drainage Amount None None     No associated orders.    Discharge Exam:   Vitals:   01/30/23 0007 01/30/23 0316  01/30/23 0412 01/30/23 0753  BP: (!) 99/46 (!) 96/52 (!) 98/54 (!) 98/47  Pulse: 94 83 79 81  Resp: 17 18  16   Temp: 98 F (36.7 C) 97.9 F (36.6 C)  (!) 97.4 F (36.3 C)  TempSrc: Oral Oral  Oral  SpO2: 94% 96%  97%  Height:        Body mass index is 20.19 kg/m.  General exam: Pleasant, elderly Caucasian female. Skin: No rashes, lesions or ulcers. HEENT: Ecchymosis noted in facial area due to mandibular fracture.  Has trouble opening mouth Lungs: Clear to auscultation bilaterally CVS: Regular rate and rhythm, no murmur GI/Abd soft, nontender, nondistended, bowels are present CNS: Alert, awake, oriented x 3 Psychiatry: Mood appropriate Extremities: No pedal edema, no calf tenderness  Follow ups:    Follow-up Information     Melissa Montane, MD .   Specialty: Otolaryngology Why: [phone] Contact information: [address] [place] [address] [phone]         Leeroy Cha, MD Follow up.   Specialty: Internal Medicine Contact information: [address] [place] [address] [phone]                 Discharge Instructions:   Discharge Instructions     Call MD for:  difficulty breathing, headache or visual disturbances   Complete by: As directed    Call MD for:  difficulty breathing, headache or visual disturbances   Complete by: As directed    Call MD for:  extreme fatigue   Complete by: As directed    Call MD for:  extreme fatigue   Complete by: As directed    Call MD for:  hives   Complete by: As directed    Call MD for:  hives   Complete by: As directed    Call MD for:  persistant dizziness or light-headedness   Complete by: As directed    Call MD for:  persistant dizziness or light-headedness   Complete by: As directed    Call MD for:  persistant nausea and vomiting   Complete by: As directed    Call MD for:  persistant nausea and vomiting   Complete by: As directed    Call MD for:  redness,  tenderness, or signs of infection (pain, swelling, redness, odor or green/yellow discharge around incision site)   Complete by: As directed    Call MD for:  severe uncontrolled pain   Complete by: As directed    Call MD for:  severe uncontrolled pain   Complete by: As directed    Call MD for:  temperature >100.4   Complete by: As directed    Call MD for:  temperature >100.4   Complete by: As directed    Diet - low sodium heart healthy   Complete by: As directed    Diet general   Complete by: As directed    Soft diet   Discharge instructions   Complete by: As directed    Keep wire cutters with you at all times.  Follow-up in about 2 weeks sooner if any of the  wires begin to be loose.  Stay on a full liquid to very soft diet.  Irrigate with saline to clean the wound that is in the bottom lip inside.  Place antibiotic cream on the outside skin wound.  Take the clindamycin for the solid 7 days.  Pain medication as needed but take Motrin and Tylenol first and that should suffice without a narcotic.   Discharge instructions   Complete by: As directed    Recommendations at discharge:   Keep irbesartan on hold because of low blood pressure.  Continue to monitor blood pressure at home.  If systolic blood pressure is over 140, restart irbesartan.  Antibiotics as recommended by ENT  Follow-up with ENT as an outpatient.  General discharge instructions: Follow with Primary MD Leeroy Cha, MD in 7 days  Please request your PCP  to go over your hospital tests, procedures, radiology results at the follow up. Please get your medicines reviewed and adjusted.  Your PCP may decide to repeat certain labs or tests as needed. Do not drive, operate heavy machinery, perform activities at heights, swimming or participation in water activities or provide baby sitting services if your were admitted for syncope or siezures until you have seen by Primary MD or a Neurologist and advised to do so  again. [place] Controlled Substance Reporting System database was reviewed. Do not drive, operate heavy machinery, perform activities at heights, swim, participate in water activities or provide baby-sitting services while on medications for pain, sleep and mood until your outpatient physician has reevaluated you and advised to do so again.  You are strongly recommended to comply with the dose, frequency and duration of prescribed medications. Activity: As tolerated with Full fall precautions use walker/cane & assistance as needed Avoid using any recreational substances like cigarette, tobacco, alcohol, or non-prescribed drug. If you experience worsening of your admission symptoms, develop shortness of breath, life threatening emergency, suicidal or homicidal thoughts you must seek medical attention immediately by calling 911 or calling your MD immediately  if symptoms less severe. You must read complete instructions/literature along with all the possible adverse reactions/side effects for all the medicines you take and that have been prescribed to you. Take any new medicine only after you have completely understood and accepted all the possible adverse reactions/side effects.  Wear Seat belts while driving. You were cared for by a hospitalist during your hospital stay. If you have any questions about your discharge medications or the care you received while you were in the hospital after you are discharged, you can call the unit and ask to speak with the hospitalist or the covering physician. Once you are discharged, your primary care physician will handle any further medical issues. Please note that NO REFILLS for any discharge medications will be authorized once you are discharged, as it is imperative that you return to your primary care physician (or establish a relationship with a primary care physician if you do not have one).   Discharge wound care:   Complete by: As directed    Increase  activity slowly   Complete by: As directed    Increase activity slowly   Complete by: As directed    No dressing needed   Complete by: As directed        Discharge Medications:   Allergies as of 01/30/2023       Reactions   Augmentin [amoxicillin-pot Clavulanate] Diarrhea   Nystatin Diarrhea, Rash        Medication List  TAKE these medications    albuterol-ipratropium 18-103 MCG/ACT inhaler Commonly known as: COMBIVENT Inhale 1 puff into the lungs as needed for wheezing or shortness of breath.   ASCORBIC ACID PO Take 250 mg by mouth daily.   b complex vitamins tablet Take 1 tablet by mouth daily.   Breztri Aerosphere 160-9-4.8 MCG/ACT Aero Generic drug: Budeson-Glycopyrrol-Formoterol Inhale 2 puffs into the lungs in the morning and at bedtime.   buPROPion 300 MG 24 hr tablet Commonly known as: WELLBUTRIN XL Take 300 mg by mouth daily.   clindamycin 300 MG capsule Commonly known as: CLEOCIN Take 1 capsule (300 mg total) by mouth 3 (three) times daily for 10 days.   famotidine 20 MG tablet Commonly known as: PEPCID Take 20 mg by mouth at bedtime.   HYDROcodone-acetaminophen 5-325 MG tablet Commonly known as: NORCO/VICODIN Take 1 tablet by mouth every 6 (six) hours as needed for moderate pain.   irbesartan 150 MG tablet Commonly known as: AVAPRO TAKE 1 TABLET(150 MG) BY MOUTH DAILY What changed: See the new instructions.   MAGNESIUM CITRATE PO Take 1 capsule by mouth daily. for RLL   Probiotic Chew Chew 2 tablets by mouth daily.   TURMERIC-GINGER PO Take 1 tablet by mouth daily.   Vitamin D-3 25 MCG (1000 UT) Caps Take 1 capsule by mouth daily.               Durable Medical Equipment  (From admission, onward)           Start     Ordered   01/30/23 1537  For home use only DME oxygen  Once       Question Answer Comment  Length of Need Lifetime   Mode or (Route) Nasal cannula   Liters per Minute 2   Frequency Continuous  (stationary and portable oxygen unit needed)   Oxygen conserving device Yes   Oxygen delivery system Gas      01/30/23 1537              Discharge Care Instructions  (From admission, onward)           Start     Ordered   01/30/23 0000  Discharge wound care:        01/30/23 1558   01/29/23 0000  No dressing needed        01/29/23 1233             The results of significant diagnostics from this hospitalization (including imaging, microbiology, ancillary and laboratory) are listed below for reference.    Procedures and Diagnostic Studies:   DG Chest Port 1 View  Result Date: 01/29/2023 CLINICAL DATA:  Hypoxia EXAM: PORTABLE CHEST 1 VIEW COMPARISON:  01/17/2022, 12/20/2021 FINDINGS: Calcified breast implants. No acute airspace disease or pleural effusion. Stable cardiomediastinal silhouette with aortic atherosclerosis. IMPRESSION: No active disease. Electronically Signed   By: Donavan Foil M.D.   On: 01/29/2023 15:22   CT Head Wo Contrast  Result Date: 01/28/2023 CLINICAL DATA:  Fall, broken teeth, concern for mandibular fracture EXAM: CT HEAD WITHOUT CONTRAST CT MAXILLOFACIAL WITHOUT CONTRAST TECHNIQUE: Multidetector CT imaging of the head and maxillofacial structures were performed using the standard protocol without intravenous contrast. Multiplanar CT image reconstructions of the maxillofacial structures were also generated. RADIATION DOSE REDUCTION: This exam was performed according to the departmental dose-optimization program which includes automated exposure control, adjustment of the mA and/or kV according to patient size and/or use of iterative reconstruction technique. COMPARISON:  None Available.  FINDINGS: CT HEAD FINDINGS Brain: No evidence of acute infarction, hemorrhage, hydrocephalus, extra-axial collection or mass lesion/mass effect. Mild periventricular white matter hypodensity. Vascular: No hyperdense vessel or unexpected calcification. CT FACIAL BONES  FINDINGS Skull: Normal. Negative for fracture or focal lesion. Mandible: Comminuted, minimally displaced fractures of the mandibular body (series 4, image 15). Additional nondisplaced fractures of the right mandibular condyle and head, extending into the temporomandibular joint, without dislocation of the joint (series 4, image 40). Facial bones: No displaced fractures or dislocations of the facial bones proper. Sinuses/Orbits: No acute finding. Other: Soft tissue laceration of the chin. IMPRESSION: 1. No acute intracranial pathology. Small-vessel white matter disease. 2. Comminuted, minimally displaced fractures of the mandibular body. Additional nondisplaced fractures of the right mandibular condyle and head, extending into the temporomandibular joint, without dislocation of the joint. 3. No displaced fractures or dislocations of the facial bones proper. 4. Soft tissue laceration of the chin. Electronically Signed   By: Delanna Ahmadi M.D.   On: 01/28/2023 16:01   CT Maxillofacial Wo Contrast  Result Date: 01/28/2023 CLINICAL DATA:  Fall, broken teeth, concern for mandibular fracture EXAM: CT HEAD WITHOUT CONTRAST CT MAXILLOFACIAL WITHOUT CONTRAST TECHNIQUE: Multidetector CT imaging of the head and maxillofacial structures were performed using the standard protocol without intravenous contrast. Multiplanar CT image reconstructions of the maxillofacial structures were also generated. RADIATION DOSE REDUCTION: This exam was performed according to the departmental dose-optimization program which includes automated exposure control, adjustment of the mA and/or kV according to patient size and/or use of iterative reconstruction technique. COMPARISON:  None Available. FINDINGS: CT HEAD FINDINGS Brain: No evidence of acute infarction, hemorrhage, hydrocephalus, extra-axial collection or mass lesion/mass effect. Mild periventricular white matter hypodensity. Vascular: No hyperdense vessel or unexpected calcification.  CT FACIAL BONES FINDINGS Skull: Normal. Negative for fracture or focal lesion. Mandible: Comminuted, minimally displaced fractures of the mandibular body (series 4, image 15). Additional nondisplaced fractures of the right mandibular condyle and head, extending into the temporomandibular joint, without dislocation of the joint (series 4, image 40). Facial bones: No displaced fractures or dislocations of the facial bones proper. Sinuses/Orbits: No acute finding. Other: Soft tissue laceration of the chin. IMPRESSION: 1. No acute intracranial pathology. Small-vessel white matter disease. 2. Comminuted, minimally displaced fractures of the mandibular body. Additional nondisplaced fractures of the right mandibular condyle and head, extending into the temporomandibular joint, without dislocation of the joint. 3. No displaced fractures or dislocations of the facial bones proper. 4. Soft tissue laceration of the chin. Electronically Signed   By: Delanna Ahmadi M.D.   On: 01/28/2023 16:01   CT Cervical Spine Wo Contrast  Result Date: 01/28/2023 CLINICAL DATA:  Neck trauma (Age >= 65y) EXAM: CT CERVICAL SPINE WITHOUT CONTRAST TECHNIQUE: Multidetector CT imaging of the cervical spine was performed without intravenous contrast. Multiplanar CT image reconstructions were also generated. RADIATION DOSE REDUCTION: This exam was performed according to the departmental dose-optimization program which includes automated exposure control, adjustment of the mA and/or kV according to patient size and/or use of iterative reconstruction technique. COMPARISON:  None Available. FINDINGS: Alignment: Straightening of the cervical lordosis with slight reversal likely due to patient positioning. There is grade 1 anterolisthesis at C3-C4. grade 1 degenerative anterolisthesis at C7-T1. Skull base and vertebrae: There is no evidence of acute cervical spine fracture. There is a minimally displaced fracture of the right mandibular condyle. Soft  tissues and spinal canal: No prevertebral fluid or swelling. No visible canal hematoma. Disc levels: There is severe  degenerative disc disease from C4 through C7. Multilevel facet arthropathy, severe on the left at C3-C4 and on the right at C2-C3 and C7-T1. There is at least mild to moderate bilateral neural foraminal narrowing at C4-C5, C5-C6, and C6-C7. Upper chest: Negative. Other: Vascular calcifications. Apical predominant centrilobular emphysema. IMPRESSION: No evidence acute cervical spine fracture. Partially visualized minimally displaced fracture of the right mandibular condyle. Severe degenerative disc disease from C4 through C7 with bilateral neural foraminal narrowing at these levels. Multilevel facet arthropathy, severe on the left at C3-C4 and on the right at C2-C3 and C7-T1. Electronically Signed   By: Maurine Simmering M.D.   On: 01/28/2023 15:59   DG Hand Complete Right  Result Date: 01/28/2023 CLINICAL DATA:  [place] EXAM: RIGHT HAND - COMPLETE 3+ VIEW COMPARISON:  None Available. FINDINGS: There is no evidence of acute fracture. 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 There is cystic change in the lunate. IMPRESSION: No acute osseous abnormality. Severe thumb CMC joint osteoarthritis. Electronically Signed   By: Maurine Simmering M.D.   On: 01/28/2023 15:51   DG Hand Complete Left  Result Date: 01/28/2023 CLINICAL DATA:  [place] EXAM: LEFT HAND - COMPLETE 3+ VIEW COMPARISON:  None Available. FINDINGS: There is no evidence of acute fracture. 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 There is no bone erosion or periostitis. Soft tissues are unremarkable. IMPRESSION: No acute osseous abnormality. Severe thumb CMC joint osteoarthritis. Electronically Signed   By: Maurine Simmering M.D.   On: 01/28/2023 15:50   DG Wrist Complete Left  Result Date: 01/28/2023 CLINICAL DATA:  Trauma, fall, pain EXAM: LEFT WRIST - COMPLETE 3+ VIEW COMPARISON:  01/28/2023 FINDINGS: Bones are osteopenic and advanced  degenerative arthropathy of the left wrist first [facility] joint with joint space loss, sclerosis and bony spurring of the trapezium and first metacarpal articulation. No acute osseous finding, fracture or malalignment. Distal radius, ulna and carpal bones appear intact. No focal soft tissue abnormality. IMPRESSION: Osteopenia and degenerative changes as above. No acute osseous finding by plain radiography. Electronically Signed   By: Jerilynn Mages.  Shick M.D.   On: 01/28/2023 15:37   DG Wrist Complete Right  Result Date: 01/28/2023 CLINICAL DATA:  Fall, trauma, pain EXAM: RIGHT WRIST - COMPLETE 3+ VIEW COMPARISON:  01/28/2023 FINDINGS: Bones are osteopenic. Distal radius, ulna, and carpal bones appear intact. No acute osseous finding or fracture. No malalignment. Degenerative osteoarthritis of the right wrist first [place] joint with joint space loss, sclerosis and bony spurring involving the trapezium and first metatarsal articulation. Benign cystic change noted in the lunate. No focal soft tissue abnormality or swelling. IMPRESSION: Osteopenia and degenerative changes as above. No acute osseous finding by plain radiography. Electronically Signed   By: Jerilynn Mages.  Shick M.D.   On: 01/28/2023 15:36   DG Knee Complete 4 Views Right  Result Date: 01/28/2023 CLINICAL DATA:  Fall, trauma, pain EXAM: RIGHT KNEE - COMPLETE 4+ VIEW COMPARISON:  01/28/2023 FINDINGS: No evidence of fracture, dislocation, or joint effusion. No evidence of arthropathy or other focal bone abnormality. Soft tissues are unremarkable. IMPRESSION: Negative. Electronically Signed   By: Jerilynn Mages.  Shick M.D.   On: 01/28/2023 15:34   DG Knee Complete 4 Views Left  Result Date: 01/28/2023 CLINICAL DATA:  Fall, trauma, pain EXAM: LEFT KNEE - COMPLETE 4+ VIEW COMPARISON:  None Available. FINDINGS: No evidence of fracture, dislocation, or joint effusion. No evidence of arthropathy or other focal bone abnormality. Soft tissues are unremarkable. IMPRESSION: No acute osseous  finding  Electronically Signed   By: Jerilynn Mages.  Shick M.D.   On: 01/28/2023 15:31     Labs:   Basic Metabolic Panel: Recent Labs  Lab 01/29/23 1528 01/30/23 0339  NA 128* 135  K 4.6 4.6  CL 95* 102  CO2 21* 25  GLUCOSE 106* 103*  BUN 15 16  CREATININE 0.88 0.80  CALCIUM 8.6* 8.9   GFR CrCl cannot be calculated (Unknown ideal weight.). Liver Function Tests: Recent Labs  Lab 01/29/23 1528 01/30/23 0339  AST 35 31  ALT 26 22  ALKPHOS 69 60  BILITOT 1.2 1.0  PROT 6.3* 5.5*  ALBUMIN 3.4* 2.9*   No results for input(s): "LIPASE", "AMYLASE" in the last 168 hours. No results for input(s): "AMMONIA" in the last 168 hours. Coagulation profile No results for input(s): "INR", "PROTIME" in the last 168 hours.  CBC: Recent Labs  Lab 01/29/23 0620 01/29/23 1528 01/30/23 0339  WBC 14.5* 13.8* 13.3*  HGB 12.2 12.7 11.6*  HCT 36.4 40.0 36.6  MCV 97.8 100.5* 100.5*  PLT 179 207 197   Cardiac Enzymes: No results for input(s): "CKTOTAL", "CKMB", "CKMBINDEX", "TROPONINI" in the last 168 hours. BNP: Invalid input(s): "POCBNP" CBG: No results for input(s): "GLUCAP" in the last 168 hours. D-Dimer No results for input(s): "DDIMER" in the last 72 hours. Hgb A1c No results for input(s): "HGBA1C" in the last 72 hours. Lipid Profile No results for input(s): "CHOL", "HDL", "LDLCALC", "TRIG", "CHOLHDL", "LDLDIRECT" in the last 72 hours. Thyroid function studies No results for input(s): "TSH", "T4TOTAL", "T3FREE", "THYROIDAB" in the last 72 hours.  Invalid input(s): "FREET3" Anemia work up No results for input(s): "VITAMINB12", "FOLATE", "FERRITIN", "TIBC", "IRON", "RETICCTPCT" in the last 72 hours. Microbiology No results found for this or any previous visit (from the past 240 hour(s)).  Time coordinating discharge: 45 minutes  Signed: Tycho Cheramie  [facility] 01/30/2023, 3:58 PM

## 2023-01-30 NOTE — Progress Notes (Signed)
   01/30/23 1500  Mobility  Activity Ambulated with assistance in hallway  Level of Assistance Modified independent, requires aide device or extra time  Assistive Device Front wheel walker  Distance Ambulated (ft) 250 ft  Activity Response Tolerated well  Mobility Referral Yes  $Mobility charge 1 Mobility   Mobility Specialist Progress Note  Pt was in bed and agreeable. Had no c/o pain. Returned to bed w/ all needs met and call bell in reach  [place] Specialist  Please contact via Solicitor or Rehab office at [phone]

## 2023-02-03 DIAGNOSIS — S0269XS Fracture of mandible of other specified site, sequela: Secondary | ICD-10-CM | POA: Diagnosis not present

## 2023-02-03 DIAGNOSIS — F3341 Major depressive disorder, recurrent, in partial remission: Secondary | ICD-10-CM | POA: Diagnosis not present

## 2023-02-03 DIAGNOSIS — E46 Unspecified protein-calorie malnutrition: Secondary | ICD-10-CM | POA: Diagnosis not present

## 2023-02-03 DIAGNOSIS — J449 Chronic obstructive pulmonary disease, unspecified: Secondary | ICD-10-CM | POA: Diagnosis not present

## 2023-02-03 DIAGNOSIS — I10 Essential (primary) hypertension: Secondary | ICD-10-CM | POA: Diagnosis not present

## 2023-02-03 DIAGNOSIS — W19XXXA Unspecified fall, initial encounter: Secondary | ICD-10-CM | POA: Diagnosis not present

## 2023-02-03 DIAGNOSIS — R54 Age-related physical debility: Secondary | ICD-10-CM | POA: Diagnosis not present

## 2023-02-06 ENCOUNTER — Encounter (HOSPITAL_COMMUNITY): Payer: Self-pay | Admitting: Otolaryngology

## 2023-02-07 DIAGNOSIS — S0181XA Laceration without foreign body of other part of head, initial encounter: Secondary | ICD-10-CM | POA: Diagnosis not present

## 2023-02-07 DIAGNOSIS — Z4802 Encounter for removal of sutures: Secondary | ICD-10-CM | POA: Diagnosis not present

## 2023-02-08 ENCOUNTER — Telehealth: Payer: Self-pay | Admitting: Otolaryngology

## 2023-02-08 NOTE — Telephone Encounter (Signed)
Thanks Olivia Mackie, sounds like a good plan

## 2023-02-08 NOTE — Telephone Encounter (Signed)
Spoke with the brother of patient.  Kim Higgins told him that she thought her wires are looser than after surgery.  I inquired if she was able to open her mouth, or move teeth/jaw. Patient stated no, and bother thought by observing her she has not changed.  However, she has missing back teeth and is able to drink from a straw from that area.  Both agreed to keep her follow up appointment on 4/3 with [facility].

## 2023-02-08 NOTE — Telephone Encounter (Signed)
Pt is wondering how loose her wires can be.  They are looser than they were.  Sx 3/17 with Dr. Janace Hoard.  We secheduled f/u for 02/15/23. Please contact brother, Barnabas Lister. [phone].

## 2023-02-15 ENCOUNTER — Encounter: Payer: Self-pay | Admitting: Physician Assistant

## 2023-02-15 ENCOUNTER — Ambulatory Visit (INDEPENDENT_AMBULATORY_CARE_PROVIDER_SITE_OTHER): Payer: Medicare Other | Admitting: Otolaryngology

## 2023-02-15 ENCOUNTER — Encounter: Payer: Medicare Other | Admitting: Physician Assistant

## 2023-02-15 ENCOUNTER — Encounter (INDEPENDENT_AMBULATORY_CARE_PROVIDER_SITE_OTHER): Payer: Self-pay | Admitting: Otolaryngology

## 2023-02-15 VITALS — BP 152/75 | HR 58

## 2023-02-15 DIAGNOSIS — W19XXXD Unspecified fall, subsequent encounter: Secondary | ICD-10-CM

## 2023-02-15 DIAGNOSIS — S02609D Fracture of mandible, unspecified, subsequent encounter for fracture with routine healing: Secondary | ICD-10-CM

## 2023-02-15 MED ORDER — CLINDAMYCIN HCL 150 MG PO CAPS: 150.0000 mg | ORAL_CAPSULE | Freq: Three times a day (TID) | ORAL | 0 refills | Status: AC

## 2023-02-15 MED ORDER — CHLORHEXIDINE GLUCONATE 0.12 % MT SOLN: 15.0000 mL | Freq: Two times a day (BID) | OROMUCOSAL | 0 refills | Status: AC

## 2023-02-15 NOTE — Progress Notes (Signed)
Kim Higgins is an 77 y.o. female.   Chief Complaint: mandible fx HPI: hx of mandible fx. She is doing well with mandible fx with no pain. She is having difficulty with her COPD she states.  She is having episodes where she is having panic attacks and breathing issues.  She is not taking one of her inhalers because her teeth are wired shut.  She is taking a albuterol nebulizer.  She has no significant swelling at this point.  Past Medical History:  Diagnosis Date   COPD (chronic obstructive pulmonary disease)    Hypertension    PVD (peripheral vascular disease)    asymptomatic, mild LSCA stenosis   Tobacco abuse    discontinued    Past Surgical History:  Procedure Laterality Date   CAROTID DUPLEX  05/24/10   RIGHT & LEFT ICA=NO DIAMETER REDUCTION. ORGIN OF LEFT CCA=EQUAL TO OR LESS THAN 70% REDUCTION. ORGIN OF LEFT SUCLAVIIAN=70-99% REDUCTION. LEFT VERTEBRAL=TO-FRO FLOW NOTED.   MYOCARDICAL PERFUSION STUDY  06/23/06   NORMAL PATTREN OF PERFUSION IN ALL REGIONS. EF% NOT ESTIMATED SECONDARY TO INABILITY TO GATE.   ORIF MANDIBULAR FRACTURE N/A 01/29/2023   Procedure: OPEN REDUCTION INTERNAL FIXATION (ORIF) MANDIBULAR FRACTURE;  Surgeon: Melissa Montane, MD;  Location: [facility];  Service: ENT;  Laterality: N/A;   TRANSTHORACIC ECHOCARDIOGRAM  03/14/02   EF 60%, MILDLY THICKENED AORTIC VALVE WITH NO STENOSIS. MILD MR. OBSTRUCTION OF TRICUSPID VALVE WITH MILD TR. NORMAL RV SIZE AND FUNCTION.   UPPER ARTERIAL EXAM  05/27/11   LEFT PROXIMAL SUBCLAVIN ARTERY: SL ELEVATED VELOCITIES SUGGESTIVE OF 50% SIGNIFICANT DIAMETER REDUCTION.    Family History  Problem Relation Age of Onset   Hypertension Father    CAD Brother    Heart attack Paternal Uncle    Heart attack Paternal Grandfather    Social History:  reports that she quit smoking about 8 years ago. Her smoking use included cigarettes. She has never used smokeless tobacco. She reports current alcohol use of about 4.0 standard drinks of alcohol per  week. She reports that she does not currently use drugs.  Allergies:  Allergies  Allergen Reactions   Augmentin [Amoxicillin-Pot Clavulanate] Diarrhea   Nystatin Diarrhea and Rash    (Not in a hospital admission)   No results found for this or any previous visit (from the past 48 hour(s)). No results found.   Blood pressure (!) 152/75, pulse (!) 58, SpO2 (!) 79 %.  PHYSICAL EXAM: Appearance _ awake alert with no distress.  Head- atraumatic and no obvious abnormalities Eyes- PERRL, EOMI, no conjunctiva injection or ecchymosis Ears-  Right- Pinna without inflammation or swelling. canal without obstruction or injury. TM within normal limits  Left- Pinna without inflammation or swelling. canal without obstruction or injury. TM within normal limits Nose- no lesions or masses.  Oc/OP-the wires are tight and the arch bars are in place.  The left lateral plate is exposed 1 screw.  There is some granulation tissue.  The swelling of her mandible has substantially decreased if not resolved.  Palpation of the mandible reveals no pain except in the area where the plate is exposed there is mild discomfort.  The wires were cut but the arch bars were left in place.  With the wires cut she feels like her occlusion is very close to normal. Hp/Larynx- normal voice and no airway issues. No swelling or lesions Neck- no mass or lesions. Normal movement.  Neuro- CNII-XII intact, no sensory deficits.  Lungs- normal effort no distress  noted     Assessment/Plan Mandible fracture-she had a comminuted fracture of the anterior mandible with several plates placed.  It was solid at the time of the operation so given the fact she has a TMJ fracture and is having breathing problems because of being wired shut I cut the wires.  She will stay strict liquid diet.  I am going to start her on antibiotics again and use some Peridex.  We talked about the exposed plate.  We talked about the risk of taking her out of the  wires but her breathing is obviously more important.  She will start back on her inhalers.  She will follow-up next week sooner if anything is worsening and if she develops any pain.  Melissa Montane 02/15/2023, 9:46 AM

## 2023-02-23 ENCOUNTER — Ambulatory Visit (INDEPENDENT_AMBULATORY_CARE_PROVIDER_SITE_OTHER): Payer: Medicare Other | Admitting: Otolaryngology

## 2023-02-23 DIAGNOSIS — W19XXXD Unspecified fall, subsequent encounter: Secondary | ICD-10-CM

## 2023-02-23 DIAGNOSIS — S02609D Fracture of mandible, unspecified, subsequent encounter for fracture with routine healing: Secondary | ICD-10-CM

## 2023-02-23 NOTE — Progress Notes (Signed)
Kim Higgins is an 77 y.o. female.   Chief Complaint: mandible fx HPI: she has no pain. She feels like the teeth meet but says they are jacked up. The molars meet together. She is on liquid diet  Past Medical History:  Diagnosis Date   COPD (chronic obstructive pulmonary disease)    Hypertension    PVD (peripheral vascular disease)    asymptomatic, mild LSCA stenosis   Tobacco abuse    discontinued    Past Surgical History:  Procedure Laterality Date   CAROTID DUPLEX  05/24/10   RIGHT & LEFT ICA=NO DIAMETER REDUCTION. ORGIN OF LEFT CCA=EQUAL TO OR LESS THAN 70% REDUCTION. ORGIN OF LEFT SUCLAVIIAN=70-99% REDUCTION. LEFT VERTEBRAL=TO-FRO FLOW NOTED.   MYOCARDICAL PERFUSION STUDY  06/23/06   NORMAL PATTREN OF PERFUSION IN ALL REGIONS. EF% NOT ESTIMATED SECONDARY TO INABILITY TO GATE.   ORIF MANDIBULAR FRACTURE N/A 01/29/2023   Procedure: OPEN REDUCTION INTERNAL FIXATION (ORIF) MANDIBULAR FRACTURE;  Surgeon: [name], MD;  Location: [facility] OR;  Service: ENT;  Laterality: N/A;   TRANSTHORACIC ECHOCARDIOGRAM  03/14/02   EF 60%, MILDLY THICKENED AORTIC VALVE WITH NO STENOSIS. MILD MR. OBSTRUCTION OF TRICUSPID VALVE WITH MILD TR. NORMAL RV SIZE AND FUNCTION.   UPPER ARTERIAL EXAM  05/27/11   LEFT PROXIMAL SUBCLAVIN ARTERY: SL ELEVATED VELOCITIES SUGGESTIVE OF 50% SIGNIFICANT DIAMETER REDUCTION.    Family History  Problem Relation Age of Onset   Hypertension Father    CAD Brother    Heart attack Paternal Uncle    Heart attack Paternal Grandfather    Social History:  reports that she quit smoking about 8 years ago. Her smoking use included cigarettes. She has never used smokeless tobacco. She reports current alcohol use of about 4.0 standard drinks of alcohol per week. She reports that she does not currently use drugs.  Allergies:  Allergies  Allergen Reactions   Augmentin [Amoxicillin-Pot Clavulanate] Diarrhea   Nystatin Diarrhea and Rash    (Not in a hospital admission)   No results  found for this or any previous visit (from the past 48 hour(s)). No results found.   There were no vitals taken for this visit.  PHYSICAL EXAM: Appearance _ awake alert with no distress.  Head- atraumatic and no obvious abnormalities Eyes- PERRL, EOMI, no conjunctiva injection or ecchymosis Ears-  Right- Pinna without inflammation or swelling. canal without obstruction or injury. TM within normal limits  Left- Pinna without inflammation or swelling. canal without obstruction or injury. TM within normal limits Nose- no lesions or masses.  Oc/OP-she has what appears to be occlusive teeth but the left does look angled inward. The arch bars are still present. No pain with palpation of the mandible the plate on the left is still exposed. Hp/Larynx- normal voice and no airway issues. No swelling or lesions Neck- no mass or lesions. Normal movement.  Neuro- CNII-XII intact, no sensory deficits.  Lungs- normal effort no distress noted    Assessment/Plan Mandible fx- she feels like her teeth are off but they look like they meet well. There is still an exposed plate. She has no pain and palpation no pain. Will continue the liquid diet and see her back Wednesday. She is breathing better with the wires off.  [name] 02/23/2023, 9:18 AM

## 2023-03-01 ENCOUNTER — Encounter (INDEPENDENT_AMBULATORY_CARE_PROVIDER_SITE_OTHER): Payer: Self-pay | Admitting: Otolaryngology

## 2023-03-01 ENCOUNTER — Ambulatory Visit (INDEPENDENT_AMBULATORY_CARE_PROVIDER_SITE_OTHER): Payer: Medicare Other | Admitting: Otolaryngology

## 2023-03-01 VITALS — BP 158/75

## 2023-03-01 DIAGNOSIS — W19XXXD Unspecified fall, subsequent encounter: Secondary | ICD-10-CM

## 2023-03-01 DIAGNOSIS — S02609D Fracture of mandible, unspecified, subsequent encounter for fracture with routine healing: Secondary | ICD-10-CM

## 2023-03-01 NOTE — Progress Notes (Signed)
Kim Higgins is an 77 y.o. female.   Chief Complaint: mandible fx HPI: hx of ORIF 4 weeks ago. She is having numbness of the lower lip still. She has no sigificant pain. Teeth are ok from occlusion point of view.   Past Medical History:  Diagnosis Date   COPD (chronic obstructive pulmonary disease)    Hypertension    PVD (peripheral vascular disease)    asymptomatic, mild LSCA stenosis   Tobacco abuse    discontinued    Past Surgical History:  Procedure Laterality Date   CAROTID DUPLEX  05/24/10   RIGHT & LEFT ICA=NO DIAMETER REDUCTION. ORGIN OF LEFT CCA=EQUAL TO OR LESS THAN 70% REDUCTION. ORGIN OF LEFT SUCLAVIIAN=70-99% REDUCTION. LEFT VERTEBRAL=TO-FRO FLOW NOTED.   MYOCARDICAL PERFUSION STUDY  06/23/06   NORMAL PATTREN OF PERFUSION IN ALL REGIONS. EF% NOT ESTIMATED SECONDARY TO INABILITY TO GATE.   ORIF MANDIBULAR FRACTURE N/A 01/29/2023   Procedure: OPEN REDUCTION INTERNAL FIXATION (ORIF) MANDIBULAR FRACTURE;  Surgeon: [name], MD;  Location: [facility] OR;  Service: ENT;  Laterality: N/A;   TRANSTHORACIC ECHOCARDIOGRAM  03/14/02   EF 60%, MILDLY THICKENED AORTIC VALVE WITH NO STENOSIS. MILD MR. OBSTRUCTION OF TRICUSPID VALVE WITH MILD TR. NORMAL RV SIZE AND FUNCTION.   UPPER ARTERIAL EXAM  05/27/11   LEFT PROXIMAL SUBCLAVIN ARTERY: SL ELEVATED VELOCITIES SUGGESTIVE OF 50% SIGNIFICANT DIAMETER REDUCTION.    Family History  Problem Relation Age of Onset   Hypertension Father    CAD Brother    Heart attack Paternal Uncle    Heart attack Paternal Grandfather    Social History:  reports that she quit smoking about 8 years ago. Her smoking use included cigarettes. She has never used smokeless tobacco. She reports current alcohol use of about 4.0 standard drinks of alcohol per week. She reports that she does not currently use drugs.  Allergies:  Allergies  Allergen Reactions   Augmentin [Amoxicillin-Pot Clavulanate] Diarrhea   Nystatin Diarrhea and Rash    (Not in a hospital  admission)   No results found for this or any previous visit (from the past 48 hour(s)). No results found.    Blood pressure (!) 158/75, SpO2 93 %.  PHYSICAL EXAM: Appearance _ awake alert with no distress.  Head- atraumatic and no obvious abnormalities Eyes- PERRL, EOMI, no conjunctiva injection or ecchymosis Ears-  Right- Pinna without inflammation or swelling. canal without obstruction or injury. TM within normal limits  Left- Pinna without inflammation or swelling. canal without obstruction or injury. TM within normal limits Nose- no lesions or masses.  Oc/OP- the bite seems good . No evidence of infection. Swelling almost completely resolved. The left mandible is slightly tilted medially. The plate is beginning to cover. There is no pain with palpation. The arch bars were removed without problem.  Hp/Larynx- normal voice and no airway issues. No swelling or lesions Neck- no mass or lesions. Normal movement.  Neuro- CNII-XII intact, no sensory deficits.  Lungs- normal effort no distress noted     Assessment/Plan Mandible fracture- she will need some dental work in future but not for many months. The plate is covering slowing. She needs to stay on liquid diet for another week and follow up in 2 weeks. Call if there is any change or increasing pain.   [name] 03/01/2023, 9:22 AM

## 2023-03-03 ENCOUNTER — Ambulatory Visit (INDEPENDENT_AMBULATORY_CARE_PROVIDER_SITE_OTHER): Payer: Medicare Other | Admitting: Pulmonary Disease

## 2023-03-03 ENCOUNTER — Encounter: Payer: Self-pay | Admitting: Pulmonary Disease

## 2023-03-03 VITALS — BP 130/84 | HR 91 | Ht 63.5 in | Wt 96.4 lb

## 2023-03-03 DIAGNOSIS — J449 Chronic obstructive pulmonary disease, unspecified: Secondary | ICD-10-CM

## 2023-03-03 MED ORDER — BUSPIRONE HCL 5 MG PO TABS: 5.0000 mg | ORAL_TABLET | Freq: Three times a day (TID) | ORAL | 1 refills | Status: AC

## 2023-03-03 NOTE — Patient Instructions (Signed)
Prescription for BuSpar sent to pharmacy for you The cost of the medication is less to your mail order company  Continue using Breztri twice a day  Albuterol as needed  Graded activities as tolerated  I will see you in about 3 months  Call with significant concerns

## 2023-03-03 NOTE — Progress Notes (Signed)
Kim Higgins    [id number]    10/03/1946  Primary Care Physician:Varadarajan, Soyla Murphy, MD  Referring Physician: Lorenda Ishihara, MD [address] [place],  [place] [address]  Chief complaint:   Patient being seen for COPD  HPI:  Patient with a history of COPD that was stable on Breztri and albuterol  Recently had a fall and had mandibular fracture that required wiring. Did develop some anxiety and panic attacks following her traumatic injury  She feels her breathing has Kim been steady since then  She just had the wiring removed in the last 2 weeks and is trying to get back into using her inhalers on a regular basis  She was recently prescribed BuSpar  She lost about 10 pounds since her injuries  She gets anxious easily, does contribute to her shortness of breath Outpatient Encounter Medications as of 03/03/2023  Medication Sig   acetaminophen (TYLENOL) 325 MG tablet Take 650 mg by mouth every 6 (six) hours as needed.   albuterol (VENTOLIN HFA) 108 (90 Base) MCG/ACT inhaler 2 puff as needed Inhalation every 6 hrs for 30 days   albuterol-ipratropium (COMBIVENT) 18-103 MCG/ACT inhaler Inhale 1 puff into the lungs as needed for wheezing or shortness of breath.   ASCORBIC ACID PO Take 250 mg by mouth daily.   b complex vitamins tablet Take 1 tablet by mouth daily.   BREZTRI AEROSPHERE 160-9-4.8 MCG/ACT AERO Inhale 2 puffs into the lungs in the morning and at bedtime.   buPROPion (WELLBUTRIN XL) 300 MG 24 hr tablet Take 300 mg by mouth daily.   busPIRone (BUSPAR) 5 MG tablet Take 1 tablet (5 mg total) by mouth 3 (three) times daily.   chlorhexidine (PERIDEX) 0.12 % solution Use as directed 15 mLs in the mouth or throat 2 (two) times daily.   Cholecalciferol (VITAMIN D-3) 1000 UNITS CAPS Take 1 capsule by mouth daily.   famotidine (PEPCID) 20 MG tablet Take 20 mg by mouth at bedtime.   HYDROcodone-acetaminophen (NORCO/VICODIN) 5-325 MG tablet Take 1  tablet by mouth every 6 (six) hours as needed for moderate pain.   irbesartan (AVAPRO) 150 MG tablet TAKE 1 TABLET(150 MG) BY MOUTH DAILY   MAGNESIUM CITRATE PO Take 1 capsule by mouth daily. for RLL   Probiotic CHEW Chew 2 tablets by mouth daily.   TURMERIC-GINGER PO Take 1 tablet by mouth daily.   busPIRone (BUSPAR) 5 MG tablet 1 tablet Orally Twice a day for 15 days (Patient Kim taking: Reported on 03/03/2023)   No facility-administered encounter medications on Higgins as of 03/03/2023.    Allergies as of 03/03/2023 - Review Complete 03/03/2023  Allergen Reaction Noted   Amoxicillin Diarrhea 09/05/2022   Augmentin [amoxicillin-pot clavulanate] Diarrhea 01/28/2023   Grass pollen(k-o-r-t-swt vern)  03/03/2023   Nystatin Diarrhea and Rash 07/14/2015    Past Medical History:  Diagnosis Date   COPD (chronic obstructive pulmonary disease)    Hypertension    PVD (peripheral vascular disease)    asymptomatic, mild LSCA stenosis   Tobacco abuse    discontinued    Past Surgical History:  Procedure Laterality Date   CAROTID DUPLEX  05/24/10   RIGHT & LEFT ICA=NO DIAMETER REDUCTION. ORGIN OF LEFT CCA=EQUAL TO OR LESS THAN 70% REDUCTION. ORGIN OF LEFT SUCLAVIIAN=70-99% REDUCTION. LEFT VERTEBRAL=TO-FRO FLOW NOTED.   MYOCARDICAL PERFUSION STUDY  06/23/06   NORMAL PATTREN OF PERFUSION IN ALL REGIONS. EF% Kim ESTIMATED SECONDARY TO INABILITY TO GATE.  ORIF MANDIBULAR FRACTURE N/A 01/29/2023   Procedure: OPEN REDUCTION INTERNAL FIXATION (ORIF) MANDIBULAR FRACTURE;  Surgeon: Kim Obey, MD;  Location: [facility] OR;  Service: ENT;  Laterality: N/A;   TRANSTHORACIC ECHOCARDIOGRAM  03/14/02   EF 60%, MILDLY THICKENED AORTIC VALVE WITH NO STENOSIS. MILD MR. OBSTRUCTION OF TRICUSPID VALVE WITH MILD TR. NORMAL RV SIZE AND FUNCTION.   UPPER ARTERIAL EXAM  05/27/11   LEFT PROXIMAL SUBCLAVIN ARTERY: SL ELEVATED VELOCITIES SUGGESTIVE OF 50% SIGNIFICANT DIAMETER REDUCTION.    Family History  Problem Relation Age of  Onset   Hypertension Father    CAD Brother    Heart attack Paternal Uncle    Heart attack Paternal Grandfather     Social History   Socioeconomic History   Marital status: Single    Spouse name: Kim Higgins   Number of children: Kim Higgins   Years of education: Kim Higgins   Highest education level: Kim Higgins  Occupational History   Kim Higgins  Tobacco Use   Smoking status: Former    Packs/day: 0.00    Years: 30.00    Additional pack years: 0.00    Total pack years: 0.00    Types: Cigarettes    Quit date: 08/14/2014    Years since quitting: 8.5   Smokeless tobacco: Never  Substance and Sexual Activity   Alcohol use: Yes    Alcohol/week: 4.0 standard drinks of alcohol    Types: 4 Standard drinks or equivalent per week   Drug use: Kim Currently   Sexual activity: Kim Higgins  Other Topics Concern   Kim Higgins  Social History Narrative   Kim Higgins   Social Determinants of Health   Financial Resource Strain: Kim Higgins  Food Insecurity: Kim Higgins  Transportation Needs: Kim Higgins  Physical Activity: Kim Higgins  Stress: Kim Higgins  Social Connections: Kim Higgins  Intimate Partner Violence: Kim Higgins    Review of Systems  Respiratory:  Positive for shortness of breath.     Vitals:   03/03/23 1311  BP: 130/84  Pulse: 91  SpO2: 94%     Physical Exam Constitutional:      Appearance: Normal appearance.  HENT:     Head: Normocephalic.     Mouth/Throat:     Mouth: Mucous membranes are moist.  Eyes:     General: No scleral icterus.    Pupils: Pupils are equal, round, and reactive to light.  Cardiovascular:     Rate and Rhythm: Normal rate and regular rhythm.     Heart sounds: No murmur heard.    No friction rub.  Pulmonary:     Effort: No respiratory distress.     Breath sounds: No stridor. No wheezing or rhonchi.  Musculoskeletal:     Cervical back: No rigidity or tenderness.  Neurological:     Mental Status: She is alert.   Psychiatric:        Mood and Affect: Mood normal.    Data Reviewed: Chest x-ray 01/29/2023-reviewed by myself No infiltrative process noted  No PFT on record  Assessment:  Chronic obstructive pulmonary disease  Shortness of breath on exertion  Anxiety  Plan/Recommendations: Prescription for BuSpar 5 mg 3 times daily  Continue using Breztri  Albuterol use as needed  Will consider pulmonary function test when patient more stable  Follow-up in about 3 months  Encouraged to call with significant concerns   Virl Diamond MD  [facility] 03/03/2023, 1:59 PM  CC: Kim Higgins,*

## 2023-03-08 DIAGNOSIS — J449 Chronic obstructive pulmonary disease, unspecified: Secondary | ICD-10-CM | POA: Diagnosis not present

## 2023-03-08 DIAGNOSIS — E041 Nontoxic single thyroid nodule: Secondary | ICD-10-CM | POA: Diagnosis not present

## 2023-03-08 DIAGNOSIS — R54 Age-related physical debility: Secondary | ICD-10-CM | POA: Diagnosis not present

## 2023-03-08 DIAGNOSIS — I10 Essential (primary) hypertension: Secondary | ICD-10-CM | POA: Diagnosis not present

## 2023-03-08 DIAGNOSIS — F410 Panic disorder [episodic paroxysmal anxiety] without agoraphobia: Secondary | ICD-10-CM | POA: Diagnosis not present

## 2023-03-14 DIAGNOSIS — F4323 Adjustment disorder with mixed anxiety and depressed mood: Secondary | ICD-10-CM | POA: Diagnosis not present

## 2023-03-17 ENCOUNTER — Ambulatory Visit (INDEPENDENT_AMBULATORY_CARE_PROVIDER_SITE_OTHER): Payer: Medicare Other | Admitting: Otolaryngology

## 2023-03-17 ENCOUNTER — Encounter (INDEPENDENT_AMBULATORY_CARE_PROVIDER_SITE_OTHER): Payer: Self-pay | Admitting: Otolaryngology

## 2023-03-17 VITALS — BP 149/72 | HR 73

## 2023-03-17 DIAGNOSIS — S02611D Fracture of condylar process of right mandible, subsequent encounter for fracture with routine healing: Secondary | ICD-10-CM

## 2023-03-17 DIAGNOSIS — W19XXXD Unspecified fall, subsequent encounter: Secondary | ICD-10-CM

## 2023-03-17 DIAGNOSIS — S02609D Fracture of mandible, unspecified, subsequent encounter for fracture with routine healing: Secondary | ICD-10-CM

## 2023-03-17 NOTE — Progress Notes (Signed)
Kim Higgins is an 77 y.o. female.   Chief Complaint: mandible fx HPI: hx of ORIF of mandible fx. She has seen dentist who will fix teeth later. She has no pain and occlusion is ok and same as previous.   Past Medical History:  Diagnosis Date   COPD (chronic obstructive pulmonary disease) (HCC)    Hypertension    PVD (peripheral vascular disease) (HCC)    asymptomatic, mild LSCA stenosis   Tobacco abuse    discontinued    Past Surgical History:  Procedure Laterality Date   CAROTID DUPLEX  05/24/10   RIGHT & LEFT ICA=NO DIAMETER REDUCTION. ORGIN OF LEFT CCA=EQUAL TO OR LESS THAN 70% REDUCTION. ORGIN OF LEFT SUCLAVIIAN=70-99% REDUCTION. LEFT VERTEBRAL=TO-FRO FLOW NOTED.   MYOCARDICAL PERFUSION STUDY  06/23/06   NORMAL PATTREN OF PERFUSION IN ALL REGIONS. EF% NOT ESTIMATED SECONDARY TO INABILITY TO GATE.   ORIF MANDIBULAR FRACTURE N/A 01/29/2023   Procedure: OPEN REDUCTION INTERNAL FIXATION (ORIF) MANDIBULAR FRACTURE;  Surgeon: [name], MD;  Location: [facility] OR;  Service: ENT;  Laterality: N/A;   TRANSTHORACIC ECHOCARDIOGRAM  03/14/02   EF 60%, MILDLY THICKENED AORTIC VALVE WITH NO STENOSIS. MILD MR. OBSTRUCTION OF TRICUSPID VALVE WITH MILD TR. NORMAL RV SIZE AND FUNCTION.   UPPER ARTERIAL EXAM  05/27/11   LEFT PROXIMAL SUBCLAVIN ARTERY: SL ELEVATED VELOCITIES SUGGESTIVE OF 50% SIGNIFICANT DIAMETER REDUCTION.    Family History  Problem Relation Age of Onset   Hypertension Father    CAD Brother    Heart attack Paternal Uncle    Heart attack Paternal Grandfather    Social History:  reports that she quit smoking about 8 years ago. Her smoking use included cigarettes. She has never used smokeless tobacco. She reports current alcohol use of about 4.0 standard drinks of alcohol per week. She reports that she does not currently use drugs.  Allergies:  Allergies  Allergen Reactions   Amoxicillin Diarrhea   Augmentin [Amoxicillin-Pot Clavulanate] Diarrhea   Grass Pollen(K-O-R-T-Swt Vern)     Nystatin Diarrhea and Rash    (Not in a hospital admission)   No results found for this or any previous visit (from the past 48 hour(s)). No results found.   Blood pressure (!) 149/72, pulse 73, SpO2 99 %.  PHYSICAL EXAM: Appearance _ awake alert with no distress.  Head- atraumatic and no obvious abnormalities Eyes- PERRL, EOMI, no conjunctiva injection or ecchymosis Ears-  Right- Pinna without inflammation or swelling. canal without obstruction or injury. TM within normal limits  Left- Pinna without inflammation or swelling. canal without obstruction or injury. TM within normal limits Nose- no lesions or masses.  Oc/OP- teeth still medially slightly distracted on the left. The screw head is all that is showing now so 90% closed. Occlusion same but good palpation of the mandible fracture with bimanuel manipulation has no movement or pain. no lesions or excessive swelling. Mouth opening normal.  Hp/Larynx- normal voice and no airway issues. No swelling or lesions Neck- no mass or lesions. Normal movement.  Neuro- CNII-XII intact, no sensory deficits.  Lungs- normal effort no distress noted      Assessment/Plan Mandible fx- she is dong well and no pain or new issues. Her occlusion is same and she has seen the dentist. There is no movement is the mandible by palpation. The plate is almost covered. She will have the dentist at her request check the plate coverage one more time. She can increease to soft diet for week and follow up as  needed.   [name] 03/17/2023, 9:51 AM

## 2023-04-14 DIAGNOSIS — F4323 Adjustment disorder with mixed anxiety and depressed mood: Secondary | ICD-10-CM | POA: Diagnosis not present

## 2023-04-25 DIAGNOSIS — F419 Anxiety disorder, unspecified: Secondary | ICD-10-CM | POA: Diagnosis not present

## 2023-04-28 ENCOUNTER — Encounter (INDEPENDENT_AMBULATORY_CARE_PROVIDER_SITE_OTHER): Payer: Self-pay | Admitting: Otolaryngology

## 2023-04-28 ENCOUNTER — Telehealth: Payer: Self-pay | Admitting: Otolaryngology

## 2023-04-28 ENCOUNTER — Ambulatory Visit (INDEPENDENT_AMBULATORY_CARE_PROVIDER_SITE_OTHER): Payer: Medicare Other | Admitting: Otolaryngology

## 2023-04-28 VITALS — BP 115/71 | HR 81

## 2023-04-28 DIAGNOSIS — S02609D Fracture of mandible, unspecified, subsequent encounter for fracture with routine healing: Secondary | ICD-10-CM

## 2023-04-28 DIAGNOSIS — S02611D Fracture of condylar process of right mandible, subsequent encounter for fracture with routine healing: Secondary | ICD-10-CM

## 2023-04-28 DIAGNOSIS — W19XXXD Unspecified fall, subsequent encounter: Secondary | ICD-10-CM

## 2023-04-28 MED ORDER — CLINDAMYCIN HCL 300 MG PO CAPS: 300.0000 mg | ORAL_CAPSULE | Freq: Three times a day (TID) | ORAL | 0 refills | Status: AC

## 2023-04-28 NOTE — Progress Notes (Signed)
Kim Higgins is an 77 y.o. female.   Chief Complaint: screw exposed HPI: she has only very minor irritation of the left gingiva. She feels like the occlusion is reasonable.   Past Medical History:  Diagnosis Date   COPD (chronic obstructive pulmonary disease) (HCC)    Hypertension    PVD (peripheral vascular disease) (HCC)    asymptomatic, mild LSCA stenosis   Tobacco abuse    discontinued    Past Surgical History:  Procedure Laterality Date   CAROTID DUPLEX  05/24/10   RIGHT & LEFT ICA=NO DIAMETER REDUCTION. ORGIN OF LEFT CCA=EQUAL TO OR LESS THAN 70% REDUCTION. ORGIN OF LEFT SUCLAVIIAN=70-99% REDUCTION. LEFT VERTEBRAL=TO-FRO FLOW NOTED.   MYOCARDICAL PERFUSION STUDY  06/23/06   NORMAL PATTREN OF PERFUSION IN ALL REGIONS. EF% NOT ESTIMATED SECONDARY TO INABILITY TO GATE.   ORIF MANDIBULAR FRACTURE N/A 01/29/2023   Procedure: OPEN REDUCTION INTERNAL FIXATION (ORIF) MANDIBULAR FRACTURE;  Surgeon: [name], MD;  Location: [facility] OR;  Service: ENT;  Laterality: N/A;   TRANSTHORACIC ECHOCARDIOGRAM  03/14/02   EF 60%, MILDLY THICKENED AORTIC VALVE WITH NO STENOSIS. MILD MR. OBSTRUCTION OF TRICUSPID VALVE WITH MILD TR. NORMAL RV SIZE AND FUNCTION.   UPPER ARTERIAL EXAM  05/27/11   LEFT PROXIMAL SUBCLAVIN ARTERY: SL ELEVATED VELOCITIES SUGGESTIVE OF 50% SIGNIFICANT DIAMETER REDUCTION.    Family History  Problem Relation Age of Onset   Hypertension Father    CAD Brother    Heart attack Paternal Uncle    Heart attack Paternal Grandfather    Social History:  reports that she quit smoking about 8 years ago. Her smoking use included cigarettes. She has never used smokeless tobacco. She reports current alcohol use of about 4.0 standard drinks of alcohol per week. She reports that she does not currently use drugs.  Allergies:  Allergies  Allergen Reactions   Amoxicillin Diarrhea   Augmentin [Amoxicillin-Pot Clavulanate] Diarrhea   Grass Pollen(K-O-R-T-Swt Vern)    Nystatin Diarrhea and Rash     (Not in a hospital admission)   No results found for this or any previous visit (from the past 48 hour(s)). No results found.   Blood pressure 115/71, pulse 81, SpO2 95 %.  PHYSICAL EXAM: Appearance _ awake alert with no distress.  Head- atraumatic and no obvious abnormalities Eyes- PERRL, EOMI, no conjunctiva injection or ecchymosis Ears-  Right- Pinna without inflammation or swelling. canal without obstruction or injury. TM within normal limits  Left- Pinna without inflammation or swelling. canal without obstruction or injury. TM within normal limits Nose- no lesions or masses.  Oc/OP- the left mucosa has granulation tissue and a small area of exposed plate. The area was sprayed and the plate could be seen with suction but no screw. It is very vascular. no lesions or excessive swelling. Mouth opening normal.  Hp/Larynx- normal voice and no airway issues. No swelling or lesions Neck- no mass or lesions. Normal movement.  Neuro- CNII-XII intact, no sensory deficits.  Lungs- normal effort no distress noted     Assessment/Plan She is still having a exposed plate that we discussed options. Will first try antibiotics and mouth rinse and if still not healed in 3 weeks will need to go to OR and remove the plate. She agrees as she does not want surgery  [name] 04/28/2023, 11:43 AM

## 2023-04-28 NOTE — Telephone Encounter (Signed)
Patient called to remind Dr. Jearld Fenton is allergic to Augmentin and Amoxicillin.

## 2023-04-28 NOTE — Telephone Encounter (Signed)
Pt called needing to speak with French Ana or Dr Jearld Fenton about her medications before ordering an antibiotic

## 2023-04-28 NOTE — Addendum Note (Signed)
Addended by: Leonette Most on: 04/28/2023 12:12 PM   Modules accepted: Orders

## 2023-05-01 ENCOUNTER — Ambulatory Visit
Admission: RE | Admit: 2023-05-01 | Discharge: 2023-05-01 | Disposition: A | Discharge status: Home / Self Care | Payer: Medicare Other | Source: Ambulatory Visit | Attending: Internal Medicine | Admitting: Internal Medicine

## 2023-05-01 ENCOUNTER — Other Ambulatory Visit: Payer: Self-pay | Admitting: Internal Medicine

## 2023-05-01 DIAGNOSIS — E46 Unspecified protein-calorie malnutrition: Secondary | ICD-10-CM | POA: Diagnosis not present

## 2023-05-01 DIAGNOSIS — M129 Arthropathy, unspecified: Secondary | ICD-10-CM | POA: Diagnosis not present

## 2023-05-01 DIAGNOSIS — M19049 Primary osteoarthritis, unspecified hand: Secondary | ICD-10-CM

## 2023-05-01 DIAGNOSIS — E559 Vitamin D deficiency, unspecified: Secondary | ICD-10-CM | POA: Diagnosis not present

## 2023-05-10 DIAGNOSIS — I10 Essential (primary) hypertension: Secondary | ICD-10-CM | POA: Diagnosis not present

## 2023-05-10 DIAGNOSIS — K219 Gastro-esophageal reflux disease without esophagitis: Secondary | ICD-10-CM | POA: Diagnosis not present

## 2023-05-10 DIAGNOSIS — J449 Chronic obstructive pulmonary disease, unspecified: Secondary | ICD-10-CM | POA: Diagnosis not present

## 2023-05-14 DIAGNOSIS — F4323 Adjustment disorder with mixed anxiety and depressed mood: Secondary | ICD-10-CM | POA: Diagnosis not present

## 2023-05-19 ENCOUNTER — Ambulatory Visit (INDEPENDENT_AMBULATORY_CARE_PROVIDER_SITE_OTHER): Payer: Medicare Other | Admitting: Otolaryngology

## 2023-05-19 DIAGNOSIS — W19XXXD Unspecified fall, subsequent encounter: Secondary | ICD-10-CM | POA: Diagnosis not present

## 2023-05-19 DIAGNOSIS — S02611D Fracture of condylar process of right mandible, subsequent encounter for fracture with routine healing: Secondary | ICD-10-CM

## 2023-05-19 DIAGNOSIS — S02609D Fracture of mandible, unspecified, subsequent encounter for fracture with routine healing: Secondary | ICD-10-CM

## 2023-05-19 NOTE — Progress Notes (Signed)
Kim Higgins is an 77 y.o. female.   Chief Complaint: mandible fx HPI: still with occlusion problems that she cannot get ahold of the referral dentists that her dentist gave her. No pain. Check plate exposure today  Past Medical History:  Diagnosis Date   COPD (chronic obstructive pulmonary disease) (HCC)    Hypertension    PVD (peripheral vascular disease) (HCC)    asymptomatic, mild LSCA stenosis   Tobacco abuse    discontinued    Past Surgical History:  Procedure Laterality Date   CAROTID DUPLEX  05/24/10   RIGHT & LEFT ICA=NO DIAMETER REDUCTION. ORGIN OF LEFT CCA=EQUAL TO OR LESS THAN 70% REDUCTION. ORGIN OF LEFT SUCLAVIIAN=70-99% REDUCTION. LEFT VERTEBRAL=TO-FRO FLOW NOTED.   MYOCARDICAL PERFUSION STUDY  06/23/06   NORMAL PATTREN OF PERFUSION IN ALL REGIONS. EF% NOT ESTIMATED SECONDARY TO INABILITY TO GATE.   ORIF MANDIBULAR FRACTURE N/A 01/29/2023   Procedure: OPEN REDUCTION INTERNAL FIXATION (ORIF) MANDIBULAR FRACTURE;  Surgeon: [name], MD;  Location: [facility] OR;  Service: ENT;  Laterality: N/A;   TRANSTHORACIC ECHOCARDIOGRAM  03/14/02   EF 60%, MILDLY THICKENED AORTIC VALVE WITH NO STENOSIS. MILD MR. OBSTRUCTION OF TRICUSPID VALVE WITH MILD TR. NORMAL RV SIZE AND FUNCTION.   UPPER ARTERIAL EXAM  05/27/11   LEFT PROXIMAL SUBCLAVIN ARTERY: SL ELEVATED VELOCITIES SUGGESTIVE OF 50% SIGNIFICANT DIAMETER REDUCTION.    Family History  Problem Relation Age of Onset   Hypertension Father    CAD Brother    Heart attack Paternal Uncle    Heart attack Paternal Grandfather    Social History:  reports that she quit smoking about 8 years ago. Her smoking use included cigarettes. She has never used smokeless tobacco. She reports current alcohol use of about 4.0 standard drinks of alcohol per week. She reports that she does not currently use drugs.  Allergies:  Allergies  Allergen Reactions   Amoxicillin Diarrhea   Augmentin [Amoxicillin-Pot Clavulanate] Diarrhea   Grass  Pollen(K-O-R-T-Swt Vern)    Nystatin Diarrhea and Rash    (Not in a hospital admission)   No results found for this or any previous visit (from the past 48 hour(s)). No results found.   There were no vitals taken for this visit.  PHYSICAL EXAM: Appearance _ awake alert with no distress.  Head- atraumatic and no obvious abnormalities Eyes- PERRL, EOMI, no conjunctiva injection or ecchymosis Ears-  Right- Pinna without inflammation or swelling. canal without obstruction or injury. TM within normal limits  Left- Pinna without inflammation or swelling. canal without obstruction or injury. TM within normal limits Nose- no lesions or masses.  Oc/OP- the plate is not exposed and there is granulation tissue in the area no swelling. no lesions or excessive swelling. Mouth opening normal.  Hp/Larynx- normal voice and no airway issues. No swelling or lesions Neck- no mass or lesions. Normal movement.  Neuro- CNII-XII intact, no sensory deficits.  Lungs- normal effort no distress noted     Assessment/Plan Mandible fracture- she still is having issues with occlusion and the plate is not exposed but granulation tissue is still present. She wants to see a oral surgeon and dentist first before removing the plate. I will call [name] who she cannot get to call back and refer to [name] as well.   [name] 05/19/2023, 9:11 AM

## 2023-05-22 DIAGNOSIS — I10 Essential (primary) hypertension: Secondary | ICD-10-CM | POA: Diagnosis not present

## 2023-05-22 DIAGNOSIS — E041 Nontoxic single thyroid nodule: Secondary | ICD-10-CM | POA: Diagnosis not present

## 2023-05-22 DIAGNOSIS — E559 Vitamin D deficiency, unspecified: Secondary | ICD-10-CM | POA: Diagnosis not present

## 2023-05-24 ENCOUNTER — Telehealth: Payer: Self-pay | Admitting: Otolaryngology

## 2023-05-24 NOTE — Telephone Encounter (Signed)
Patient called and said she had jaw surgery in March and she saw him on 05/19/23. She said she talked to an oral surgeon as well and said that he agreed with Dr Jearld Fenton that the metal plate needed to be removed. She wasn't sure if she needed to see Dr Jearld Fenton again or what she needed to do to start that process

## 2023-05-25 DIAGNOSIS — Z0000 Encounter for general adult medical examination without abnormal findings: Secondary | ICD-10-CM | POA: Diagnosis not present

## 2023-05-25 DIAGNOSIS — M4726 Other spondylosis with radiculopathy, lumbar region: Secondary | ICD-10-CM | POA: Diagnosis not present

## 2023-05-25 DIAGNOSIS — R54 Age-related physical debility: Secondary | ICD-10-CM | POA: Diagnosis not present

## 2023-05-25 DIAGNOSIS — Z1331 Encounter for screening for depression: Secondary | ICD-10-CM | POA: Diagnosis not present

## 2023-05-25 DIAGNOSIS — E46 Unspecified protein-calorie malnutrition: Secondary | ICD-10-CM | POA: Diagnosis not present

## 2023-05-25 DIAGNOSIS — F3341 Major depressive disorder, recurrent, in partial remission: Secondary | ICD-10-CM | POA: Diagnosis not present

## 2023-05-25 DIAGNOSIS — I10 Essential (primary) hypertension: Secondary | ICD-10-CM | POA: Diagnosis not present

## 2023-05-25 DIAGNOSIS — I739 Peripheral vascular disease, unspecified: Secondary | ICD-10-CM | POA: Diagnosis not present

## 2023-05-25 DIAGNOSIS — Z23 Encounter for immunization: Secondary | ICD-10-CM | POA: Diagnosis not present

## 2023-05-25 DIAGNOSIS — I6523 Occlusion and stenosis of bilateral carotid arteries: Secondary | ICD-10-CM | POA: Diagnosis not present

## 2023-06-07 NOTE — Telephone Encounter (Signed)
Called patient, advised the oral surgeon will be the one removing they plate.  She was concerned about finances, because she didn't think Medicare will cover this surgery. I advised her that the surgery could be considered under  "Medical"  She still wants to keep her appointment with Dr. Jearld Fenton this Friday.

## 2023-06-09 ENCOUNTER — Ambulatory Visit (INDEPENDENT_AMBULATORY_CARE_PROVIDER_SITE_OTHER): Payer: Medicare Other | Admitting: Otolaryngology

## 2023-06-09 ENCOUNTER — Encounter (INDEPENDENT_AMBULATORY_CARE_PROVIDER_SITE_OTHER): Payer: Self-pay | Admitting: Otolaryngology

## 2023-06-09 VITALS — BP 140/79 | HR 96

## 2023-06-09 DIAGNOSIS — W19XXXD Unspecified fall, subsequent encounter: Secondary | ICD-10-CM

## 2023-06-09 DIAGNOSIS — S02609D Fracture of mandible, unspecified, subsequent encounter for fracture with routine healing: Secondary | ICD-10-CM

## 2023-06-09 DIAGNOSIS — S02611D Fracture of condylar process of right mandible, subsequent encounter for fracture with routine healing: Secondary | ICD-10-CM

## 2023-06-09 NOTE — H&P (View-Only) (Signed)
 Kim Higgins is an 77 y.o. female.   Chief Complaint: plate exposure HPI:  hx of mandible fracture with exposed plate. Now ready to remove. She will get the teeth taken care of after the mouth healed  Past Medical History:  Diagnosis Date   COPD (chronic obstructive pulmonary disease) (HCC)    Hypertension    PVD (peripheral vascular disease) (HCC)    asymptomatic, mild LSCA stenosis   Tobacco abuse    discontinued    Past Surgical History:  Procedure Laterality Date   CAROTID DUPLEX  05/24/10   RIGHT & LEFT ICA=NO DIAMETER REDUCTION. ORGIN OF LEFT CCA=EQUAL TO OR LESS THAN 70% REDUCTION. ORGIN OF LEFT SUCLAVIIAN=70-99% REDUCTION. LEFT VERTEBRAL=TO-FRO FLOW NOTED.   MYOCARDICAL PERFUSION STUDY  06/23/06   NORMAL PATTREN OF PERFUSION IN ALL REGIONS. EF% NOT ESTIMATED SECONDARY TO INABILITY TO GATE.   ORIF MANDIBULAR FRACTURE N/A 01/29/2023   Procedure: OPEN REDUCTION INTERNAL FIXATION (ORIF) MANDIBULAR FRACTURE;  Surgeon: [name], MD;  Location: [facility] OR;  Service: ENT;  Laterality: N/A;   TRANSTHORACIC ECHOCARDIOGRAM  03/14/02   EF 60%, MILDLY THICKENED AORTIC VALVE WITH NO STENOSIS. MILD MR. OBSTRUCTION OF TRICUSPID VALVE WITH MILD TR. NORMAL RV SIZE AND FUNCTION.   UPPER ARTERIAL EXAM  05/27/11   LEFT PROXIMAL SUBCLAVIN ARTERY: SL ELEVATED VELOCITIES SUGGESTIVE OF 50% SIGNIFICANT DIAMETER REDUCTION.    Family History  Problem Relation Age of Onset   Hypertension Father    CAD Brother    Heart attack Paternal Uncle    Heart attack Paternal Grandfather    Social History:  reports that she quit smoking about 38 years ago. Her smoking use included cigarettes. She has never used smokeless tobacco. She reports current alcohol use of about 4.0 standard drinks of alcohol per week. She reports that she does not currently use drugs.  Allergies:  Allergies  Allergen Reactions   Amoxicillin Diarrhea   Augmentin [Amoxicillin-Pot Clavulanate] Diarrhea   Grass Pollen(K-O-R-T-Swt Vern)     Nystatin Diarrhea and Rash    (Not in a hospital admission)   No results found for this or any previous visit (from the past 48 hour(s)). No results found.   Blood pressure (!) 140/79, pulse 96, SpO2 96%.  PHYSICAL EXAM: Appearance _ awake alert with no distress.  Head- atraumatic and no obvious abnormalities Eyes- PERRL, EOMI, no conjunctiva injection or ecchymosis Ears-  Right- Pinna without inflammation or swelling. canal without obstruction or injury. TM within normal limits  Left- Pinna without inflammation or swelling. canal without obstruction or injury. TM within normal limits Nose- no lesions or masses.  Oc/OP-  left upper plate still exposed and no significant infection no lesions or excessive swelling. Mouth opening normal.  Hp/Larynx- normal voice and no airway issues. No swelling or lesions Neck- no mass or lesions. Normal movement.  Neuro- CNII-XII intact, no sensory deficits.  Lungs- normal effort no distress noted     Assessment/Plan Mandible fracture- she has seen oral surgery and recommended the plate removal. She is ready and wants to do that in oR. Discussed the risks benefits and options. All questions answered and consent obtained. Will proceed in the outpatient center  [name] 06/09/2023, 11:21 AM

## 2023-06-09 NOTE — Progress Notes (Signed)
Kim Higgins is an 77 y.o. female.   Chief Complaint: plate exposure HPI:  hx of mandible fracture with exposed plate. Now ready to remove. She will get the teeth taken care of after the mouth healed  Past Medical History:  Diagnosis Date   COPD (chronic obstructive pulmonary disease) (HCC)    Hypertension    PVD (peripheral vascular disease) (HCC)    asymptomatic, mild LSCA stenosis   Tobacco abuse    discontinued    Past Surgical History:  Procedure Laterality Date   CAROTID DUPLEX  05/24/10   RIGHT & LEFT ICA=NO DIAMETER REDUCTION. ORGIN OF LEFT CCA=EQUAL TO OR LESS THAN 70% REDUCTION. ORGIN OF LEFT SUCLAVIIAN=70-99% REDUCTION. LEFT VERTEBRAL=TO-FRO FLOW NOTED.   MYOCARDICAL PERFUSION STUDY  06/23/06   NORMAL PATTREN OF PERFUSION IN ALL REGIONS. EF% NOT ESTIMATED SECONDARY TO INABILITY TO GATE.   ORIF MANDIBULAR FRACTURE N/A 01/29/2023   Procedure: OPEN REDUCTION INTERNAL FIXATION (ORIF) MANDIBULAR FRACTURE;  Surgeon: [name], MD;  Location: [facility] OR;  Service: ENT;  Laterality: N/A;   TRANSTHORACIC ECHOCARDIOGRAM  03/14/02   EF 60%, MILDLY THICKENED AORTIC VALVE WITH NO STENOSIS. MILD MR. OBSTRUCTION OF TRICUSPID VALVE WITH MILD TR. NORMAL RV SIZE AND FUNCTION.   UPPER ARTERIAL EXAM  05/27/11   LEFT PROXIMAL SUBCLAVIN ARTERY: SL ELEVATED VELOCITIES SUGGESTIVE OF 50% SIGNIFICANT DIAMETER REDUCTION.    Family History  Problem Relation Age of Onset   Hypertension Father    CAD Brother    Heart attack Paternal Uncle    Heart attack Paternal Grandfather    Social History:  reports that she quit smoking about 38 years ago. Her smoking use included cigarettes. She has never used smokeless tobacco. She reports current alcohol use of about 4.0 standard drinks of alcohol per week. She reports that she does not currently use drugs.  Allergies:  Allergies  Allergen Reactions   Amoxicillin Diarrhea   Augmentin [Amoxicillin-Pot Clavulanate] Diarrhea   Grass Pollen(K-O-R-T-Swt Vern)     Nystatin Diarrhea and Rash    (Not in a hospital admission)   No results found for this or any previous visit (from the past 48 hour(s)). No results found.   Blood pressure (!) 140/79, pulse 96, SpO2 96%.  PHYSICAL EXAM: Appearance _ awake alert with no distress.  Head- atraumatic and no obvious abnormalities Eyes- PERRL, EOMI, no conjunctiva injection or ecchymosis Ears-  Right- Pinna without inflammation or swelling. canal without obstruction or injury. TM within normal limits  Left- Pinna without inflammation or swelling. canal without obstruction or injury. TM within normal limits Nose- no lesions or masses.  Oc/OP-  left upper plate still exposed and no significant infection no lesions or excessive swelling. Mouth opening normal.  Hp/Larynx- normal voice and no airway issues. No swelling or lesions Neck- no mass or lesions. Normal movement.  Neuro- CNII-XII intact, no sensory deficits.  Lungs- normal effort no distress noted     Assessment/Plan Mandible fracture- she has seen oral surgery and recommended the plate removal. She is ready and wants to do that in oR. Discussed the risks benefits and options. All questions answered and consent obtained. Will proceed in the outpatient center  [name] 06/09/2023, 11:21 AM

## 2023-06-12 ENCOUNTER — Ambulatory Visit (INDEPENDENT_AMBULATORY_CARE_PROVIDER_SITE_OTHER): Payer: Medicare Other

## 2023-06-12 ENCOUNTER — Ambulatory Visit (INDEPENDENT_AMBULATORY_CARE_PROVIDER_SITE_OTHER): Payer: Medicare Other | Admitting: Internal Medicine

## 2023-06-12 ENCOUNTER — Encounter: Payer: Self-pay | Admitting: Internal Medicine

## 2023-06-12 VITALS — BP 138/82 | HR 95 | Ht 63.5 in | Wt 97.2 lb

## 2023-06-12 DIAGNOSIS — J449 Chronic obstructive pulmonary disease, unspecified: Secondary | ICD-10-CM

## 2023-06-12 DIAGNOSIS — J9601 Acute respiratory failure with hypoxia: Secondary | ICD-10-CM

## 2023-06-12 DIAGNOSIS — R918 Other nonspecific abnormal finding of lung field: Secondary | ICD-10-CM | POA: Diagnosis not present

## 2023-06-12 DIAGNOSIS — F17200 Nicotine dependence, unspecified, uncomplicated: Secondary | ICD-10-CM

## 2023-06-12 MED ORDER — BENZONATATE 200 MG PO CAPS: 200.0000 mg | ORAL_CAPSULE | Freq: Three times a day (TID) | ORAL | 1 refills | Status: DC | PRN

## 2023-06-12 MED ORDER — PREDNISONE 20 MG PO TABS: ORAL_TABLET | ORAL | 0 refills | Status: DC

## 2023-06-12 NOTE — Progress Notes (Signed)
06/12/23- 65 yoF former smoker followed by Dr Wynona Neat for COPD, last seen 03/03/23 Medical problem list includes HTN, PVD, COPD, GERD, Depression,  -Ventolin hfa, Breztri,  O2 2L Had fallen 01/28/23 and fractured mandible. Now for Acute Visit- SOB, Cough, clear phlegm No fever. No pleuritic chest pain. Kim Higgins asks about prednisone. Kim Higgins thinks Kim Higgins preferred Advair over [place]. CXR 01/29/23- 1V- FINDINGS: Calcified breast implants. No acute airspace disease or pleural effusion. Stable cardiomediastinal silhouette with aortic atherosclerosis. IMPRESSION: No active disease.  Prior to Admission medications   Medication Sig Start Date End Date Taking? Authorizing Provider  albuterol (VENTOLIN HFA) 108 (90 Base) MCG/ACT inhaler 2 puff as needed Inhalation every 6 hrs for 30 days    [provider]  ASCORBIC ACID PO Take 250 mg by mouth daily.    [provider]  b complex vitamins tablet Take 1 tablet by mouth daily.    [provider]  BREZTRI AEROSPHERE 160-9-4.8 MCG/ACT AERO Inhale 2 puffs into the lungs in the morning and at bedtime. 12/30/22   [provider]  buPROPion (WELLBUTRIN XL) 300 MG 24 hr tablet Take 300 mg by mouth daily.    [provider]  busPIRone (BUSPAR) 5 MG tablet Take 1 tablet (5 mg total) by mouth 3 (three) times daily. 03/03/23   [name], [name], MD  chlorhexidine (PERIDEX) 0.12 % solution Use as directed 15 mLs in the mouth or throat 2 (two) times daily. 02/15/23   [name], MD  Cholecalciferol (VITAMIN D-3) 1000 UNITS CAPS Take 1 capsule by mouth daily.    [provider]  famotidine (PEPCID) 20 MG tablet Take 20 mg by mouth at bedtime.    [provider]  feeding supplement (BOOST HIGH PROTEIN) LIQD Take 1 Container by mouth 3 (three) times daily between meals.    [provider]  irbesartan (AVAPRO) 150 MG tablet TAKE 1 TABLET(150 MG) BY MOUTH DAILY 07/13/20   [name], MD  MAGNESIUM  CITRATE PO Take 1 capsule by mouth daily. for RLL    [provider]  Probiotic CHEW Chew 2 tablets by mouth daily.    [provider]  TURMERIC-GINGER PO Take 1 tablet by mouth daily.    [provider]   ROS-see HPI   + = positive Constitutional:    weight loss, night sweats, fevers, chills, fatigue, lassitude. HEENT:    headaches, difficulty swallowing, tooth/dental problems, sore throat,       sneezing, itching, ear ache, nasal congestion, post nasal drip, snoring CV:    chest pain, orthopnea, PND, swelling in lower extremities, anasarca,                                  dizziness, palpitations Resp:   shortness of breath with exertion or at rest.                productive cough,   non-productive cough, coughing up of blood.              change in color of mucus.  wheezing.   Skin:    rash or lesions. GI:  No-   heartburn, indigestion, abdominal pain, nausea, vomiting, diarrhea,                 change in bowel habits, loss of appetite GU: dysuria, change in color of urine, no urgency or frequency.   flank pain. MS:  joint pain, stiffness, decreased range of motion, back pain. Neuro-     nothing unusual Psych:  change in mood or affect.  depression or anxiety.   memory loss.  OBJ- Physical Exam   Arrival O2 sat 96% on room air General- Alert, Oriented, Affect-appropriate, Distress- none acute Skin- rash-none, lesions- none, excoriation- none Lymphadenopathy- none Head- atraumatic            Eyes- Gross vision intact, PERRLA, conjunctivae and secretions clear            Ears- Hearing, canals-normal            Nose- Clear, no-Septal dev, mucus, polyps, erosion, perforation             Throat- Mallampati II , mucosa clear , drainage- none, tonsils- atrophic Neck- flexible , trachea midline, no stridor , thyroid nl, carotid no bruit Chest - symmetrical excursion , unlabored           Heart/CV- RRR , no murmur , no gallop  , no rub, nl s1 s2                            - JVD- none , edema- none, stasis changes- none, varices- none           Lung- clear to P&A, wheeze- none, cough+light , dullness-none, rub- none           Chest wall-  Abd-  Br/ Gen/ Rectal- Not done, not indicated Extrem- cyanosis- none, clubbing, none, atrophy- none, strength- nl Neuro- grossly intact to observation

## 2023-06-12 NOTE — Patient Instructions (Addendum)
Order- CXR   dx COPD exacerbation  Script sent for prednisone to take 20 mg each morning x 5 days, then stop  Script sent for Tessalon perles   1, three times daily as needed for cough  Continue your usual meds.  You can try going back to Advair instead of Breztri, to see if you like it better.  Make an appointment to see Dr Wynona Neat again in about 3 monthss

## 2023-06-13 ENCOUNTER — Telehealth: Payer: Self-pay | Admitting: Internal Medicine

## 2023-06-13 NOTE — Telephone Encounter (Signed)
Patient would like Tessalon to go to [facility]. Patient phone number is [phone].

## 2023-06-14 DIAGNOSIS — F4323 Adjustment disorder with mixed anxiety and depressed mood: Secondary | ICD-10-CM | POA: Diagnosis not present

## 2023-06-14 NOTE — Telephone Encounter (Signed)
Patient is calling again because the pharmacy still has not received a script for the medication.  Please advise and call patient when it has been sent in.  CB# [phone]

## 2023-06-15 DIAGNOSIS — F419 Anxiety disorder, unspecified: Secondary | ICD-10-CM | POA: Diagnosis not present

## 2023-06-15 NOTE — Telephone Encounter (Signed)
Pt. Calling back needs benzonatate (TESSALON) 200 MG capsule  to got to [facility]. [place] due to the cost is lower for her please advise

## 2023-06-15 NOTE — Telephone Encounter (Signed)
ATC x1 LVM for patient to call our office back regarding prior message.  

## 2023-06-16 MED ORDER — BENZONATATE 200 MG PO CAPS: 200.0000 mg | ORAL_CAPSULE | Freq: Three times a day (TID) | ORAL | 2 refills | Status: AC | PRN

## 2023-06-16 NOTE — Telephone Encounter (Signed)
Spoke to pt and she stated she got [facility] to transfer benzonatate (TESSALON) 200 MG capsule to [facility]. I verbalized understanding nothing further needed.

## 2023-06-20 ENCOUNTER — Telehealth: Payer: Self-pay | Admitting: Otolaryngology

## 2023-06-20 NOTE — Telephone Encounter (Signed)
Pt called and left vmail asking the status of sx with Dr Jearld Fenton. Thank you

## 2023-06-23 ENCOUNTER — Telehealth: Payer: Self-pay

## 2023-06-23 NOTE — Telephone Encounter (Signed)
Called patient, advised Kim Higgins our surgery scheduler will be calling her within a couple of days to schedule her procedure.  However, Dr. Jearld Fenton will not be available until the last week of August.

## 2023-06-29 ENCOUNTER — Telehealth (INDEPENDENT_AMBULATORY_CARE_PROVIDER_SITE_OTHER): Payer: Self-pay | Admitting: Physician Assistant

## 2023-06-29 NOTE — Telephone Encounter (Signed)
Per French Ana K schedule between 8/23 and 8/30. Patient is scheduled for 8/23 @ 1245 @ [facility] , [place] is calling patient back to let her know.

## 2023-06-29 NOTE — Telephone Encounter (Signed)
I spoke with Ms. Gieseking and gave her an update. She is scheduled for surgery on 07/07/2023 at 12:45. She understands to be there 2 hours early; nothing to eat or drink after midnight unless told otherwise by preop.

## 2023-06-30 NOTE — Progress Notes (Signed)
This chart was reviewed with Dr Okey Dupre, patient has hx of severe COPD and has been O2 dependent. Her last admission she needed overnight stay due to needing oxygen therapy. Heather at Dr Jearld Fenton office aware that patient will need to be done at [facility].

## 2023-07-03 ENCOUNTER — Encounter: Payer: Self-pay | Admitting: Internal Medicine

## 2023-07-03 NOTE — Assessment & Plan Note (Signed)
She reports that she has stopped.  Strongly re-enforced encouragement to stay off all tobacco.

## 2023-07-03 NOTE — Assessment & Plan Note (Signed)
Acute exacerbation Plan- CXR, prednisone burst, tessalon perles

## 2023-07-03 NOTE — Assessment & Plan Note (Signed)
Continue O2 2L for sleep and if needed, as discussed.

## 2023-07-06 ENCOUNTER — Other Ambulatory Visit: Payer: Self-pay

## 2023-07-06 ENCOUNTER — Encounter (HOSPITAL_COMMUNITY): Payer: Self-pay | Admitting: *Deleted

## 2023-07-06 NOTE — Progress Notes (Signed)
Ms Rana Snare denies chest pain or shortness of breath. Patient denies having any s/s of Covid in her household, also denies any known exposure to Covid. Ms Rana Snare was seen by Dr. Jetty Duhamel 06/12/23 for complaints of coughing and shortness of breath,  symptoms started ~ 06/05/23  Dr. Roxy Cedar note includes (copied from his note) :  Constitutional: weight loss, night sweats, fevers, chills, fatigue, lassitude.  Dr. Maple Hudson ordered Ms Geerts a 5 day dose pack of Prednisone and Tessalon for cough. Patient stated that she felt much better by the time she finished prednisone, ~ 06/17/23. I did not notice any coughing or shortness of breath while we were speaking. Ms Lowe's case was moved to [facility] due to COPD and patient was discharged with oxygen after last surgery.  Ms Rana Snare stated that she was discharged on oxygen because her mouth was wired and she could not use inhalers, she only used it at night.  Patient reports that she has not used oxygen in months  Ms Lowe's PCP is Dr. Zettie Cooley, Pulmonologist is Dr. Wyline Mood, cardiologist is Dr. Allyson Sabal.  Patient saud that she was sent to cardiologist years ago for HTN. 06/2018 Dr Allyson Sabal had Ms Hart [place] wear a cardiac monitor- "patient had complaints of dizziness at that time."  Dr. Hazle Coca note said that patient did not have any dizziness while wearing the monitor. The monitor showed NSR, SB occasional PAC's and short run of nonsustained VT.  Ms.Agerton states that she has not had dizziness in years. I asked anesthesia PA-C to review.

## 2023-07-06 NOTE — Anesthesia Preprocedure Evaluation (Addendum)
Anesthesia Evaluation  Patient identified by MRN, date of birth, ID band Patient awake    Reviewed: Allergy & Precautions, NPO status , Patient's Chart, lab work & pertinent test results  History of Anesthesia Complications Negative for: history of anesthetic complications  Airway Mallampati: III  TM Distance: >3 FB Neck ROM: Full    Dental no notable dental hx. (+) Dental Advisory Given   Pulmonary neg shortness of breath, neg sleep apnea, pneumonia, COPD,  COPD inhaler, neg recent URI, former smoker   Pulmonary exam normal breath sounds clear to auscultation       Cardiovascular hypertension, Pt. on medications (-) angina (-) Past MI and (-) CHF Normal cardiovascular exam Rhythm:Regular Rate:Normal  Echo 06/27/2022  1. Left ventricular ejection fraction, by estimation, is 55 to 60%. The left ventricle has normal function. The left ventricle has no regional wall motion abnormalities. There is mild concentric left ventricular hypertrophy. Left ventricular diastolic parameters were normal.   2. Right ventricular systolic function is normal. The right ventricular size is normal. There is normal pulmonary artery systolic pressure.   3. The mitral valve is normal in structure. Mild mitral valve regurgitation. No evidence of mitral stenosis.   4. The aortic valve is normal in structure. Aortic valve regurgitation is mild to moderate. Aortic valve sclerosis/calcification is present, without any evidence of aortic stenosis.   5. The inferior vena cava is dilated in size with >50% respiratory variability, suggesting right atrial pressure of 8 mmHg.      Neuro/Psych  PSYCHIATRIC DISORDERS Anxiety Depression    negative neurological ROS     GI/Hepatic Neg liver ROS,GERD  ,,  Endo/Other  negative endocrine ROS    Renal/GU negative Renal ROS     Musculoskeletal  (+) Arthritis ,    Abdominal   Peds  Hematology negative hematology  ROS (+)   Anesthesia Other Findings   Reproductive/Obstetrics                             Anesthesia Physical Anesthesia Plan  ASA: 3  Anesthesia Plan: MAC   Post-op Pain Management: Ofirmev IV (intra-op)*   Induction: Intravenous  PONV Risk Score and Plan: 3 and Ondansetron, Propofol infusion, TIVA and Treatment may vary due to age or medical condition  Airway Management Planned: Natural Airway  Additional Equipment: None  Intra-op Plan:   Post-operative Plan:   Informed Consent: I have reviewed the patients History and Physical, chart, labs and discussed the procedure including the risks, benefits and alternatives for the proposed anesthesia with the patient or authorized representative who has indicated his/her understanding and acceptance.     Dental advisory given  Plan Discussed with: CRNA  Anesthesia Plan Comments:         Anesthesia Quick Evaluation

## 2023-07-07 ENCOUNTER — Ambulatory Visit (HOSPITAL_COMMUNITY): Payer: Medicare Other | Admitting: Physician Assistant

## 2023-07-07 ENCOUNTER — Encounter (HOSPITAL_COMMUNITY)
Admission: RE | Disposition: A | Discharge status: Home / Self Care | Payer: Self-pay | Source: Home / Self Care | Attending: Otolaryngology

## 2023-07-07 ENCOUNTER — Other Ambulatory Visit: Payer: Self-pay

## 2023-07-07 ENCOUNTER — Ambulatory Visit (HOSPITAL_COMMUNITY)
Admission: RE | Admit: 2023-07-07 | Discharge: 2023-07-07 | Disposition: A | Discharge status: Home / Self Care | Payer: Medicare Other | Attending: Otolaryngology | Admitting: Otolaryngology

## 2023-07-07 DIAGNOSIS — Z87891 Personal history of nicotine dependence: Secondary | ICD-10-CM | POA: Insufficient documentation

## 2023-07-07 DIAGNOSIS — Z472 Encounter for removal of internal fixation device: Secondary | ICD-10-CM | POA: Diagnosis not present

## 2023-07-07 DIAGNOSIS — Y793 Surgical instruments, materials and orthopedic devices (including sutures) associated with adverse incidents: Secondary | ICD-10-CM | POA: Diagnosis not present

## 2023-07-07 DIAGNOSIS — W19XXXD Unspecified fall, subsequent encounter: Secondary | ICD-10-CM | POA: Diagnosis not present

## 2023-07-07 DIAGNOSIS — J449 Chronic obstructive pulmonary disease, unspecified: Secondary | ICD-10-CM | POA: Diagnosis not present

## 2023-07-07 DIAGNOSIS — Z8781 Personal history of (healed) traumatic fracture: Secondary | ICD-10-CM | POA: Diagnosis not present

## 2023-07-07 DIAGNOSIS — S02611D Fracture of condylar process of right mandible, subsequent encounter for fracture with routine healing: Secondary | ICD-10-CM | POA: Diagnosis not present

## 2023-07-07 DIAGNOSIS — T8489XA Other specified complication of internal orthopedic prosthetic devices, implants and grafts, initial encounter: Secondary | ICD-10-CM | POA: Diagnosis not present

## 2023-07-07 DIAGNOSIS — I10 Essential (primary) hypertension: Secondary | ICD-10-CM | POA: Diagnosis not present

## 2023-07-07 HISTORY — DX: Gastro-esophageal reflux disease without esophagitis: K21.9

## 2023-07-07 HISTORY — PX: MANDIBULAR HARDWARE REMOVAL: SHX5205

## 2023-07-07 HISTORY — DX: Unspecified osteoarthritis, unspecified site: M19.90

## 2023-07-07 HISTORY — DX: Depression, unspecified: F32.A

## 2023-07-07 HISTORY — DX: Nontoxic single thyroid nodule: E04.1

## 2023-07-07 HISTORY — DX: Anxiety disorder, unspecified: F41.9

## 2023-07-07 LAB — BASIC METABOLIC PANEL
Anion gap: 13 (ref 5–15)
BUN: 27 mg/dL — ABNORMAL HIGH (ref 8–23)
CO2: 22 mmol/L (ref 22–32)
Calcium: 9.4 mg/dL (ref 8.9–10.3)
Chloride: 102 mmol/L (ref 98–111)
Creatinine, Ser: 1.01 mg/dL — ABNORMAL HIGH (ref 0.44–1.00)
GFR, Estimated: 58 mL/min — ABNORMAL LOW (ref >60)
Glucose, Bld: 95 mg/dL (ref 70–99)
Potassium: 4 mmol/L (ref 3.5–5.1)
Sodium: 137 mmol/L (ref 135–145)

## 2023-07-07 LAB — CBC
HCT: 39.4 % (ref 36.0–46.0)
Hemoglobin: 13 g/dL (ref 12.0–15.0)
MCH: 31.7 pg (ref 26.0–34.0)
MCHC: 33 g/dL (ref 30.0–36.0)
MCV: 96.1 fL (ref 80.0–100.0)
Platelets: 230 10*3/uL (ref 150–400)
RBC: 4.1 MIL/uL (ref 3.87–5.11)
RDW: 14.7 % (ref 11.5–15.5)
WBC: 8.2 10*3/uL (ref 4.0–10.5)
nRBC: 0 % (ref 0.0–0.2)

## 2023-07-07 SURGERY — REMOVAL, HARDWARE, MANDIBLE
Anesthesia: General | Site: Mouth

## 2023-07-07 MED ORDER — LIDOCAINE-EPINEPHRINE 1 %-1:100000 IJ SOLN
INTRAMUSCULAR | Status: AC
  Filled 2023-07-07: qty 1

## 2023-07-07 MED ORDER — PROPOFOL 500 MG/50ML IV EMUL
INTRAVENOUS | Status: AC
  Filled 2023-07-07: qty 100

## 2023-07-07 MED ORDER — LIDOCAINE 2% (20 MG/ML) 5 ML SYRINGE
INTRAMUSCULAR | Status: DC | PRN
  Administered 2023-07-07: 60 mg via INTRAVENOUS

## 2023-07-07 MED ORDER — DEXAMETHASONE SODIUM PHOSPHATE 10 MG/ML IJ SOLN
INTRAMUSCULAR | Status: DC | PRN
  Administered 2023-07-07: 10 mg via INTRAVENOUS

## 2023-07-07 MED ORDER — PROPOFOL 10 MG/ML IV BOLUS
INTRAVENOUS | Status: AC
  Filled 2023-07-07: qty 20

## 2023-07-07 MED ORDER — FENTANYL CITRATE (PF) 250 MCG/5ML IJ SOLN
INTRAMUSCULAR | Status: DC | PRN
  Administered 2023-07-07: 50 ug via INTRAVENOUS

## 2023-07-07 MED ORDER — SUCCINYLCHOLINE CHLORIDE 200 MG/10ML IV SOSY
PREFILLED_SYRINGE | INTRAVENOUS | Status: DC | PRN
  Administered 2023-07-07: 60 mg via INTRAVENOUS

## 2023-07-07 MED ORDER — FENTANYL CITRATE (PF) 100 MCG/2ML IJ SOLN: 25.0000 ug | INTRAMUSCULAR | Status: DC | PRN

## 2023-07-07 MED ORDER — ORAL CARE MOUTH RINSE: 15.0000 mL | Freq: Once | OROMUCOSAL | Status: AC

## 2023-07-07 MED ORDER — DROPERIDOL 2.5 MG/ML IJ SOLN: 0.6250 mg | Freq: Once | INTRAMUSCULAR | Status: DC | PRN

## 2023-07-07 MED ORDER — LACTATED RINGERS IV SOLN: INTRAVENOUS | Status: DC

## 2023-07-07 MED ORDER — PROPOFOL 10 MG/ML IV BOLUS
INTRAVENOUS | Status: DC | PRN
  Administered 2023-07-07: 100 mg via INTRAVENOUS

## 2023-07-07 MED ORDER — 0.9 % SODIUM CHLORIDE (POUR BTL) OPTIME
TOPICAL | Status: DC | PRN
  Administered 2023-07-07: 1000 mL

## 2023-07-07 MED ORDER — ONDANSETRON HCL 4 MG/2ML IJ SOLN
INTRAMUSCULAR | Status: DC | PRN
  Administered 2023-07-07: 4 mg via INTRAVENOUS

## 2023-07-07 MED ORDER — PHENYLEPHRINE HCL-NACL 20-0.9 MG/250ML-% IV SOLN
INTRAVENOUS | Status: AC
  Filled 2023-07-07: qty 250

## 2023-07-07 MED ORDER — CHLORHEXIDINE GLUCONATE 0.12 % MT SOLN
15.0000 mL | Freq: Once | OROMUCOSAL | Status: AC
  Administered 2023-07-07: 15 mL via OROMUCOSAL
  Filled 2023-07-07: qty 15

## 2023-07-07 MED ORDER — LIDOCAINE-EPINEPHRINE 1 %-1:100000 IJ SOLN
INTRAMUSCULAR | Status: DC | PRN
  Administered 2023-07-07: 1 mL

## 2023-07-07 MED ORDER — ACETAMINOPHEN 10 MG/ML IV SOLN
INTRAVENOUS | Status: DC | PRN
Start: 2023-07-07 — End: 2023-07-07
  Administered 2023-07-07: 1000 mg via INTRAVENOUS

## 2023-07-07 MED ORDER — ACETAMINOPHEN 10 MG/ML IV SOLN
INTRAVENOUS | Status: AC
  Filled 2023-07-07: qty 100

## 2023-07-07 MED ORDER — PROPOFOL 500 MG/50ML IV EMUL
INTRAVENOUS | Status: DC | PRN
  Administered 2023-07-07: 125 ug/kg/min via INTRAVENOUS

## 2023-07-07 MED ORDER — FENTANYL CITRATE (PF) 250 MCG/5ML IJ SOLN
INTRAMUSCULAR | Status: AC
  Filled 2023-07-07: qty 5

## 2023-07-07 SURGICAL SUPPLY — 27 items
BAG COUNTER SPONGE SURGICOUNT (BAG) ×2 IMPLANT
BAG SPNG CNTER NS LX DISP (BAG) ×1
BLADE SURG 15 STRL LF DISP TIS (BLADE) IMPLANT
BLADE SURG 15 STRL SS (BLADE)
CANISTER SUCT 3000ML PPV (MISCELLANEOUS) ×2 IMPLANT
COVER SURGICAL LIGHT HANDLE (MISCELLANEOUS) ×2 IMPLANT
DRAPE HALF SHEET 40X57 (DRAPES) ×2 IMPLANT
GAUZE 4X4 16PLY ~~LOC~~+RFID DBL (SPONGE) IMPLANT
GLOVE ECLIPSE 7.5 STRL STRAW (GLOVE) ×2 IMPLANT
GOWN STRL REUS W/ TWL LRG LVL3 (GOWN DISPOSABLE) ×4 IMPLANT
GOWN STRL REUS W/TWL LRG LVL3 (GOWN DISPOSABLE) ×2
KIT BASIN OR (CUSTOM PROCEDURE TRAY) ×2 IMPLANT
KIT TURNOVER KIT B (KITS) ×2 IMPLANT
NDL HYPO 25GX1X1/2 BEV (NEEDLE) IMPLANT
NDL PRECISIONGLIDE 27X1.5 (NEEDLE) IMPLANT
NEEDLE HYPO 25GX1X1/2 BEV (NEEDLE)
NEEDLE PRECISIONGLIDE 27X1.5 (NEEDLE)
NS IRRIG 1000ML POUR BTL (IV SOLUTION) ×2 IMPLANT
PAD ARMBOARD 7.5X6 YLW CONV (MISCELLANEOUS) ×4 IMPLANT
SUCTION TUBE FRAZIER 10FR DISP (SUCTIONS) IMPLANT
SUT CHROMIC 3 0 PS 2 (SUTURE) ×2 IMPLANT
SUT MNCRL AB 4-0 PS2 18 (SUTURE) ×2 IMPLANT
SYR CONTROL 10ML LL (SYRINGE) ×2 IMPLANT
TOWEL GREEN STERILE FF (TOWEL DISPOSABLE) ×2 IMPLANT
TRAY ENT MC OR (CUSTOM PROCEDURE TRAY) ×2 IMPLANT
TUBE CONNECTING 12X1/4 (SUCTIONS) ×2 IMPLANT
YANKAUER SUCT BULB TIP NO VENT (SUCTIONS) ×2 IMPLANT

## 2023-07-07 NOTE — Op Note (Signed)
Preop/postop diagnosis: Retained plate Procedure: Removal of hardware of left lower mandible plate Anesthesia: General Estimated blood loss: Less than 5 cc Indications: 77 year old with a persistent issue with a exposed plate and screws that has not resolved.  She is now here for removal.  She was informed the risk and benefits of the procedure and options are discussed all questions were answered and consent was obtained. Procedure: Patient taken the operating placed supine position after general endotracheal tube anesthesia was examined and the area of the plate that was exposed.  It was injected 1% lidocaine with 1 100,000 epinephrine.  The 1 screw is obviously exposed and removed without need of a screwdriver.  The plate was then exposed with a 15 blade and 3 screws total were removed.  The plate was removed without difficulty.  There was no screw in one of the holes of the plate.  The area was irrigated.  The patient was then awakened brought to recovery in stable condition counts correct

## 2023-07-07 NOTE — Anesthesia Procedure Notes (Addendum)
Procedure Name: Intubation Date/Time: 07/07/2023 7:38 AM  Performed by: Lewie Loron, MDPre-anesthesia Checklist: Patient identified, Emergency Drugs available, Suction available and Patient being monitored Patient Re-evaluated:Patient Re-evaluated prior to induction Oxygen Delivery Method: Circle system utilized Preoxygenation: Pre-oxygenation with 100% oxygen Induction Type: IV induction Ventilation: Mask ventilation without difficulty Laryngoscope Size: Mac and 3 Grade View: Grade I Tube type: Oral Tube size: 7.0 mm Number of attempts: 1 Airway Equipment and Method: Stylet and Oral airway Placement Confirmation: ETT inserted through vocal cords under direct vision, positive ETCO2 and breath sounds checked- equal and bilateral Secured at: 20 cm Tube secured with: Tape Dental Injury: Teeth and Oropharynx as per pre-operative assessment

## 2023-07-07 NOTE — Anesthesia Postprocedure Evaluation (Signed)
Anesthesia Post Note  Patient: Kim Higgins  Procedure(s) Performed: MANDIBULAR HARDWARE REMOVAL (Mouth)     Patient location during evaluation: PACU Anesthesia Type: General Level of consciousness: sedated and patient cooperative Pain management: pain level controlled Vital Signs Assessment: post-procedure vital signs reviewed and stable Respiratory status: spontaneous breathing Cardiovascular status: stable Anesthetic complications: no   No notable events documented.  Last Vitals:  Vitals:   07/07/23 0815 07/07/23 0830  BP: 125/60 (!) 121/56  Pulse: 86 81  Resp: 20 16  Temp:  36.6 C  SpO2: 90% 95%    Last Pain:  Vitals:   07/07/23 0830  TempSrc:   PainSc: 0-No pain                 Lewie Loron

## 2023-07-07 NOTE — Addendum Note (Signed)
Addendum  created 07/07/23 1713 by Lewie Loron, MD   Intraprocedure Event deleted

## 2023-07-07 NOTE — Interval H&P Note (Signed)
History and Physical Interval Note:  07/07/2023 7:19 AM  Kim Higgins  has presented today for surgery, with the diagnosis of retained hardware.  The various methods of treatment have been discussed with the patient and family. After consideration of risks, benefits and other options for treatment, the patient has consented to  Procedure(s): MANDIBULAR HARDWARE REMOVAL (N/A) as a surgical intervention.  The patient's history has been reviewed, patient examined, no change in status, stable for surgery.  I have reviewed the patient's chart and labs.  Questions were answered to the patient's satisfaction.     [name]

## 2023-07-08 ENCOUNTER — Encounter (HOSPITAL_COMMUNITY): Payer: Self-pay | Admitting: Otolaryngology

## 2023-07-10 ENCOUNTER — Encounter: Payer: Self-pay | Admitting: Internal Medicine

## 2023-07-10 ENCOUNTER — Other Ambulatory Visit (HOSPITAL_BASED_OUTPATIENT_CLINIC_OR_DEPARTMENT_OTHER): Payer: Self-pay

## 2023-07-10 MED ORDER — ALBUTEROL SULFATE HFA 108 (90 BASE) MCG/ACT IN AERS: 2.0000 | INHALATION_SPRAY | Freq: Four times a day (QID) | RESPIRATORY_TRACT | 0 refills | Status: DC | PRN

## 2023-07-10 MED ORDER — FLUTICASONE-SALMETEROL 250-50 MCG/ACT IN AEPB: 2.0000 | INHALATION_SPRAY | Freq: Two times a day (BID) | RESPIRATORY_TRACT | 0 refills | Status: DC

## 2023-07-11 ENCOUNTER — Other Ambulatory Visit (HOSPITAL_BASED_OUTPATIENT_CLINIC_OR_DEPARTMENT_OTHER): Payer: Self-pay | Admitting: Internal Medicine

## 2023-07-14 ENCOUNTER — Ambulatory Visit (HOSPITAL_COMMUNITY)
Admission: RE | Admit: 2023-07-14 | Discharge: 2023-07-14 | Disposition: A | Discharge status: Home / Self Care | Payer: Medicare Other | Source: Ambulatory Visit | Attending: Cardiology | Admitting: Cardiology

## 2023-07-14 DIAGNOSIS — I6522 Occlusion and stenosis of left carotid artery: Secondary | ICD-10-CM | POA: Insufficient documentation

## 2023-07-14 DIAGNOSIS — I779 Disorder of arteries and arterioles, unspecified: Secondary | ICD-10-CM | POA: Insufficient documentation

## 2023-07-15 DIAGNOSIS — F4323 Adjustment disorder with mixed anxiety and depressed mood: Secondary | ICD-10-CM | POA: Diagnosis not present

## 2023-07-20 DIAGNOSIS — F32A Depression, unspecified: Secondary | ICD-10-CM | POA: Diagnosis not present

## 2023-07-31 ENCOUNTER — Ambulatory Visit (INDEPENDENT_AMBULATORY_CARE_PROVIDER_SITE_OTHER): Payer: Medicare Other | Admitting: Pulmonary Disease

## 2023-07-31 ENCOUNTER — Encounter: Payer: Self-pay | Admitting: Pulmonary Disease

## 2023-07-31 VITALS — BP 110/70 | HR 85 | Ht 63.5 in | Wt 101.2 lb

## 2023-07-31 DIAGNOSIS — J449 Chronic obstructive pulmonary disease, unspecified: Secondary | ICD-10-CM | POA: Diagnosis not present

## 2023-07-31 MED ORDER — FLUTICASONE-SALMETEROL 250-50 MCG/ACT IN AEPB: 1.0000 | INHALATION_SPRAY | Freq: Two times a day (BID) | RESPIRATORY_TRACT | 6 refills | Status: DC

## 2023-07-31 NOTE — Patient Instructions (Addendum)
I will see you back in about 6 months  Prescription for Advair discus sent to pharmacy for you-[facility] mail delivery  Call us with significant concerns  Continue to try to stay active  Regular exercises always helps

## 2023-07-31 NOTE — Progress Notes (Signed)
Kim Higgins    [id number]    [birth year]  Primary Care Physician:Varadarajan, Soyla Murphy, MD  Referring Physician: Lorenda Ishihara, MD [address] [place],  [place] [address]  Chief complaint:   Patient being seen for COPD  HPI:  Patient with a history of COPD that was stable on Breztri and albuterol  Was having some hoarseness with Breztri and switched back to using Advair which she finds helps better than being on [place]  She uses albuterol regularly before going to the gym or whenever she walks  Has been walking on a regular basis  Tries to get to the gym on a regular basis as well  Breathing has been relatively steady  Has had another surgery since the last time she was in the office  She was recently prescribed BuSpar  She lost about 10 pounds since her injuries  She gets anxious easily, does contribute to her shortness of breath Outpatient Encounter Medications as of 07/31/2023  Medication Sig   albuterol (PROVENTIL) (2.5 MG/3ML) 0.083% nebulizer solution Take 2.5 mg by nebulization every 2 (two) hours as needed for wheezing or shortness of breath.   B Complex-C (SUPER B COMPLEX PO) Take 1 tablet by mouth daily.   benzonatate (TESSALON) 200 MG capsule Take 1 capsule (200 mg total) by mouth 3 (three) times daily as needed for cough.   buPROPion (WELLBUTRIN XL) 300 MG 24 hr tablet Take 300 mg by mouth daily.   busPIRone (BUSPAR) 5 MG tablet Take 1 tablet (5 mg total) by mouth 3 (three) times daily. (Patient not taking: Reported on 07/05/2023)   chlorhexidine (PERIDEX) 0.12 % solution Use as directed 15 mLs in the mouth or throat 2 (two) times daily. (Patient taking differently: Use as directed 15 mLs in the mouth or throat at bedtime.)   famotidine (PEPCID) 20 MG tablet Take 20 mg by mouth at bedtime.   feeding supplement (BOOST HIGH PROTEIN) LIQD Take 1 Container by mouth 2 (two) times daily between meals.   fluticasone-salmeterol  (ADVAIR) 250-50 MCG/ACT AEPB INHALE 2 PUFFS INTO THE LUNGS IN THE MORNING AND AT BEDTIME   Gelatin 600 MG CAPS Take 600 mg by mouth daily.   irbesartan (AVAPRO) 150 MG tablet TAKE 1 TABLET(150 MG) BY MOUTH DAILY (Patient taking differently: Take 150 mg by mouth daily.)   MAGNESIUM CITRATE PO Take 600 mg by mouth at bedtime. 300 mg each for RLL   Milk Thistle 1000 MG CAPS Take 1,000 mg by mouth daily.   Polyethylene Glycol 400 (BLINK TEARS) 0.25 % SOLN Place 1 Drop/kg into both eyes daily as needed (eye lubricant).   Probiotic CHEW Chew 2 tablets by mouth daily.   sertraline (ZOLOFT) 50 MG tablet Take 25 tablets by mouth daily.   TURMERIC-GINGER PO Take 1 tablet by mouth every other day. gummy   Vitamin D-Vitamin K (VITAMIN K2-VITAMIN D3 PO) Take 1 tablet by mouth daily. Vit D3 5000 mg Vit K2 100 mg   [DISCONTINUED] albuterol (VENTOLIN HFA) 108 (90 Base) MCG/ACT inhaler Inhale 2 puffs into the lungs every 6 (six) hours as needed for wheezing or shortness of breath. Please fill brand as insurance prefers.   [DISCONTINUED] ascorbic acid (VITAMIN C) 500 MG tablet Take 500 mg by mouth daily.   [DISCONTINUED] b complex vitamins tablet Take 1 tablet by mouth daily.   [DISCONTINUED] BREZTRI AEROSPHERE 160-9-4.8 MCG/ACT AERO Inhale 2 puffs into  the lungs in the morning and at bedtime.   [DISCONTINUED] megestrol (MEGACE) 40 MG tablet Take 40 mg by mouth daily.   [DISCONTINUED] Multiple Vitamins-Minerals (SYST-AMUNE PO) Take by mouth.   No facility-administered encounter medications on file as of 07/31/2023.    Allergies as of 07/31/2023 - Review Complete 07/31/2023  Allergen Reaction Noted   Amoxicillin Diarrhea 09/05/2022   Augmentin [amoxicillin-pot clavulanate] Diarrhea 01/28/2023   Grass pollen(k-o-r-t-swt vern)  03/03/2023   Nystatin Diarrhea and Rash 07/14/2015    Past Medical History:  Diagnosis Date   Anxiety    Arthritis    COPD (chronic obstructive pulmonary disease) (HCC)     Depression    situational   GERD (gastroesophageal reflux disease)    Hypertension    Pneumonia 11/2021   PVD (peripheral vascular disease) (HCC)    asymptomatic, mild LSCA stenosis   Thyroid nodule    begnin   Tobacco abuse    discontinued    Past Surgical History:  Procedure Laterality Date   CAROTID DUPLEX  05/24/10   RIGHT & LEFT ICA=NO DIAMETER REDUCTION. ORGIN OF LEFT CCA=EQUAL TO OR LESS THAN 70% REDUCTION. ORGIN OF LEFT SUCLAVIIAN=70-99% REDUCTION. LEFT VERTEBRAL=TO-FRO FLOW NOTED.   MANDIBULAR HARDWARE REMOVAL N/A 07/07/2023   Procedure: MANDIBULAR HARDWARE REMOVAL;  Surgeon: [name], MD;  Location: [facility] OR;  Service: ENT;  Laterality: N/A;   MYOCARDICAL PERFUSION STUDY  06/23/06   NORMAL PATTREN OF PERFUSION IN ALL REGIONS. EF% NOT ESTIMATED SECONDARY TO INABILITY TO GATE.   ORIF MANDIBULAR FRACTURE N/A 01/29/2023   Procedure: OPEN REDUCTION INTERNAL FIXATION (ORIF) MANDIBULAR FRACTURE;  Surgeon: [name], MD;  Location: [facility] OR;  Service: ENT;  Laterality: N/A;   TRANSTHORACIC ECHOCARDIOGRAM  03/14/02   EF 60%, MILDLY THICKENED AORTIC VALVE WITH NO STENOSIS. MILD MR. OBSTRUCTION OF TRICUSPID VALVE WITH MILD TR. NORMAL RV SIZE AND FUNCTION.   UPPER ARTERIAL EXAM  05/27/11   LEFT PROXIMAL SUBCLAVIN ARTERY: SL ELEVATED VELOCITIES SUGGESTIVE OF 50% SIGNIFICANT DIAMETER REDUCTION.    Family History  Problem Relation Age of Onset   Hypertension Father    CAD Brother    Heart attack Paternal Uncle    Heart attack Paternal Grandfather     Social History   Socioeconomic History   Marital status: Divorced    Spouse name: Not on file   Number of children: Not on file   Years of education: Not on file   Highest education level: Not on file  Occupational History   Not on file  Tobacco Use   Smoking status: Former    Current packs/day: 0.00    Types: Cigarettes    Quit date: 08/14/1984    Years since quitting: 38.9   Smokeless tobacco: Never  Vaping Use   Vaping status:  Never Used  Substance and Sexual Activity   Alcohol use: Not Currently    Alcohol/week: 4.0 standard drinks of alcohol    Types: 4 Glasses of wine per week   Drug use: Not Currently   Sexual activity: Not on file  Other Topics Concern   Not on file  Social History Narrative   Not on file   Social Determinants of Health   Financial Resource Strain: Not on file  Food Insecurity: Not on file  Transportation Needs: Not on file  Physical Activity: Not on file  Stress: Not on file  Social Connections: Not on file  Intimate Partner Violence: Not on file    Review of Systems  Respiratory:  Positive  for shortness of breath.     Vitals:   07/31/23 1047  BP: 110/70  Pulse: 85  SpO2: 93%     Physical Exam Constitutional:      Appearance: Normal appearance.  HENT:     Head: Normocephalic.     Mouth/Throat:     Mouth: Mucous membranes are moist.  Eyes:     General: No scleral icterus.    Pupils: Pupils are equal, round, and reactive to light.  Cardiovascular:     Rate and Rhythm: Normal rate and regular rhythm.     Heart sounds: No murmur heard.    No friction rub.  Pulmonary:     Effort: No respiratory distress.     Breath sounds: No stridor. No wheezing or rhonchi.  Musculoskeletal:     Cervical back: No rigidity or tenderness.  Neurological:     Mental Status: She is alert.  Psychiatric:        Mood and Affect: Mood normal.    Data Reviewed: Chest x-ray 01/29/2023-reviewed by myself No infiltrative process noted  No PFT on record  Assessment:  Chronic obstructive pulmonary disease  Shortness of breath on exertion is better  Switched from [place] to Advair and Advair seems to be working better  Anxiety  Plan/Recommendations: Switch back to Advair 250  Prescription sent to pharmacy  Albuterol use as needed  Continue regular exercises as this has been very helpful  Follow-up in about 6 months  Call us with significant concerns    Virl Diamond MD [facility] 07/31/2023, 10:51 AM  CC: Lorenda Ishihara,*

## 2023-08-04 NOTE — Progress Notes (Signed)
This encounter was created in error - please disregard.

## 2023-08-14 DIAGNOSIS — F4323 Adjustment disorder with mixed anxiety and depressed mood: Secondary | ICD-10-CM | POA: Diagnosis not present

## 2023-08-27 NOTE — Progress Notes (Unsigned)
Cardiology Office Note:  .   Date:  08/31/2023  ID:  Lorriane Shire, DOB 1946-05-31, MRN [id number] PCP: Lorenda Ishihara, MD  [facility] Providers Cardiologist:  Nanetta Batty, MD   History of Present Illness: .   MYLENE BOW is a 77 y.o. female with a past medical history of hypertension, history of tobacco use, mild left subclavian artery stenosis, COPD.  Patient is followed by Dr. Allyson Sabal and presents today for an annual follow-up appointment.  Per chart review, patient previously wore a cardiac monitor in 04/2018 that showed sinus rhythm, a short run of NSVT, occasional PVCs.  Carotid Dopplers in 06/2021 showed 1-39% stenosis in bilateral ICAs normal flow hemodynamics were seen in bilateral subclavian arteries. There was a 25 mmHg difference from right to left brachial pressures.  Echocardiogram on 06/27/2022 showed EF 55-60%, no regional wall motion abnormalities, mild LVH, normal RV function, mild MR, mild-moderate aortic valve regurgitation.  Patient was last seen by Dr. Allyson Sabal on 08/03/2022.  At that time, patient denied chest pain or shortness of breath.  She reported occasional dizziness, but it was not frequent.  She underwent her annual carotid Doppler on 07/14/2023 that showed 1-39% stenosis in bilateral ICAs, normal flow hemodynamics in bilateral subclavian arteries.  Miss Crafton today reports that she has been recovering from a significant accident in March 2024. She was walking and she tripped, falling face first into the ground. The accident resulted in a broken jaw, scrapped shin, and dental injuries, necessitating emergency surgery and a month of jaw immobilization. The patient is still dealing with the aftermath of the accident, including dietary restrictions due to dental issues and a significant weight loss down to 93 pounds. She has been on a high-calorie Boostrix regimen to regain weight and has managed to increase her weight to 101 pounds. Reports that her COPD  symptoms, including shortness of breath, have remained stable. She recently saw her pulmonary doctor who advised her to gradually increase her walking activity. The patient has been trying to improve her balance through exercises due to some balance issues post-accident.  The patient has a history of vascular disease in her subclavian arteries, but recent scans have shown improvement. She is not on any blood thinners or cholesterol medication, and she expresses a strong preference against starting such treatments. The patient is currently on irbesartan for blood pressure management. She has not been regularly checking her blood pressure since the accident. Denies dizziness, syncope, near syncope   ROS: Denies chest pain, palpitations, dizziness, syncope, near syncope. Denies worsening shortness of breath, ankle edema, orthopnea.   Studies Reviewed: .   Cardiac Studies & Procedures     STRESS TESTS  NM MYOCAR MULTI W/SPECT W 06/21/2006   ECHOCARDIOGRAM  ECHOCARDIOGRAM COMPLETE 06/27/2022  Narrative ECHOCARDIOGRAM REPORT    Patient Name:   KASAUNDRA FAHRNEY Date of Exam: 06/27/2022 Medical Rec #:  [id number]     Height:       64.0 in Accession #:    [id number]    Weight:       110.8 lb Date of Birth:  03/16/1946      BSA:          1.522 m Patient Age:    76 years      BP:           121/73 mmHg Patient Gender: F             HR:  79 bpm. Exam Location:  [address]  Procedure: 2D Echo, Cardiac Doppler and Color Doppler  Indications:    R06.09 SOB  History:        Patient has prior history of Echocardiogram examinations, most recent 03/14/2002. Signs/Symptoms:Shortness of Breath; Risk Factors:Former Smoker and Hypertension.  Sonographer:    Samule Ohm RDCS Referring Phys: [id number] RUPASHREE VARADARAJAN  IMPRESSIONS   1. Left ventricular ejection fraction, by estimation, is 55 to 60%. The left ventricle has normal function. The left ventricle has no regional wall motion  abnormalities. There is mild concentric left ventricular hypertrophy. Left ventricular diastolic parameters were normal. 2. Right ventricular systolic function is normal. The right ventricular size is normal. There is normal pulmonary artery systolic pressure. 3. The mitral valve is normal in structure. Mild mitral valve regurgitation. No evidence of mitral stenosis. 4. The aortic valve is normal in structure. Aortic valve regurgitation is mild to moderate. Aortic valve sclerosis/calcification is present, without any evidence of aortic stenosis. 5. The inferior vena cava is dilated in size with >50% respiratory variability, suggesting right atrial pressure of 8 mmHg.  FINDINGS Left Ventricle: Left ventricular ejection fraction, by estimation, is 55 to 60%. The left ventricle has normal function. The left ventricle has no regional wall motion abnormalities. The left ventricular internal cavity size was normal in size. There is mild concentric left ventricular hypertrophy. Left ventricular diastolic parameters were normal.  Right Ventricle: The right ventricular size is normal. No increase in right ventricular wall thickness. Right ventricular systolic function is normal. There is normal pulmonary artery systolic pressure. The tricuspid regurgitant velocity is 2.55 m/s, and with an assumed right atrial pressure of 8 mmHg, the estimated right ventricular systolic pressure is 34.0 mmHg.  Left Atrium: Left atrial size was normal in size.  Right Atrium: Right atrial size was normal in size.  Pericardium: There is no evidence of pericardial effusion.  Mitral Valve: The mitral valve is normal in structure. Mild mitral valve regurgitation. No evidence of mitral valve stenosis.  Tricuspid Valve: The tricuspid valve is normal in structure. Tricuspid valve regurgitation is not demonstrated. No evidence of tricuspid stenosis.  Aortic Valve: The aortic valve is normal in structure. Aortic valve  regurgitation is mild to moderate. Aortic valve sclerosis/calcification is present, without any evidence of aortic stenosis.  Pulmonic Valve: The pulmonic valve was normal in structure. Pulmonic valve regurgitation is not visualized. No evidence of pulmonic stenosis.  Aorta: The aortic root is normal in size and structure.  Venous: The inferior vena cava is dilated in size with greater than 50% respiratory variability, suggesting right atrial pressure of 8 mmHg.  IAS/Shunts: No atrial level shunt detected by color flow Doppler.   LEFT VENTRICLE PLAX 2D LVIDd:         3.20 cm   Diastology LVIDs:         2.20 cm   LV e' medial:    9.79 cm/s LV PW:         1.00 cm   LV E/e' medial:  9.5 LV IVS:        1.10 cm   LV e' lateral:   21.10 cm/s LVOT diam:     1.60 cm   LV E/e' lateral: 4.4 LV SV:         50 LV SV Index:   33 LVOT Area:     2.01 cm   RIGHT VENTRICLE             IVC RV S  prime:     12.70 cm/s  IVC diam: 2.30 cm TAPSE (M-mode): 2.4 cm RVSP:           29.0 mmHg  LEFT ATRIUM             Index        RIGHT ATRIUM           Index LA diam:        2.80 cm 1.84 cm/m   RA Pressure: 3.00 mmHg LA Vol (A2C):   27.0 ml 17.74 ml/m  RA Area:     8.82 cm LA Vol (A4C):   33.9 ml 22.27 ml/m  RA Volume:   18.80 ml  12.35 ml/m LA Biplane Vol: 33.0 ml 21.68 ml/m AORTIC VALVE LVOT Vmax:   115.00 cm/s LVOT Vmean:  76.100 cm/s LVOT VTI:    0.251 m  AORTA Ao Root diam: 2.70 cm Ao Asc diam:  2.80 cm  MITRAL VALVE               TRICUSPID VALVE MV Area (PHT): 4.52 cm    TR Peak grad:   26.0 mmHg MV Decel Time: 168 msec    TR Vmax:        255.00 cm/s MV E velocity: 92.80 cm/s  Estimated RAP:  3.00 mmHg MV A velocity: 84.90 cm/s  RVSP:           29.0 mmHg MV E/A ratio:  1.09 SHUNTS Systemic VTI:  0.25 m Systemic Diam: 1.60 cm  Kardie Tobb DO Electronically signed by Thomasene Ripple DO Signature Date/Time: 06/27/2022/2:06:10 PM    Final    MONITORS  CARDIAC EVENT MONITOR  04/30/2018  Narrative 1. SR/SB/ST 2. Short run NSVT 3. Occasional PVCs           Risk Assessment/Calculations:             Physical Exam:   VS:  BP 102/62 (BP Location: Left Arm, Patient Position: Sitting, Cuff Size: Normal)   Pulse 90   Resp 20   Ht 5\' 3"  (1.6 m)   Wt 100 lb 12.8 oz (45.7 kg)   SpO2 93%   BMI 17.86 kg/m    Wt Readings from Last 3 Encounters:  08/31/23 100 lb 12.8 oz (45.7 kg)  07/31/23 101 lb 3.2 oz (45.9 kg)  07/07/23 100 lb (45.4 kg)    GEN: Thin elderly female. Sitting comfortably on the exam table in no acute distress NECK: No JVD; No carotid bruits CARDIAC: RRR, no murmurs, rubs, gallops. Radial pulses 2+ bilaterally  RESPIRATORY:  Clear to auscultation without rales, wheezing or rhonchi. Normal work of breathing on room air  ABDOMEN: Soft, non-tender, non-distended EXTREMITIES:  No edema in BLE; No deformity   ASSESSMENT AND PLAN: .   Hypertension - Currently on irbesartan 150 mg daily  - BP well controlled. No symptoms of hypotension or orthostatic hypotension  - K 4.0 and creatinine 1.01 in 06/2023  - Continue current regiment   Peripheral vascular disease -Doppler in 2012 showed 50% LSCA stenosis  - Most recent carotid doppler studies from 07/14/2023 showed 1-39% stenosis in bilateral ICAs, normal flow hemodynamics in bilateral subclavian arteries.  As study was essentially normal, plant to repeat if clinically indicated in the future  - LDL 69 in 05/2023. Patient against starting ASA or statin therapy at this time   Mild MR Moderate AI -Noted on echocardiogram in 06/2022.  - She is euvolemic on exam  - No indication for intervention at  this time. Anticipate monitoring with routine echocardiograms in the future   COPD  - Symptoms stable. Followed by pulmonology     Dispo: Follow up in 1 year   Signed, Jonita Albee, PA-C

## 2023-08-28 ENCOUNTER — Ambulatory Visit: Payer: Medicare Other | Admitting: Cardiovascular Disease

## 2023-08-31 ENCOUNTER — Ambulatory Visit: Payer: Medicare Other | Attending: Cardiovascular Disease | Admitting: Cardiology

## 2023-08-31 ENCOUNTER — Encounter: Payer: Self-pay | Admitting: Cardiology

## 2023-08-31 VITALS — BP 102/62 | HR 90 | Resp 20 | Ht 63.0 in | Wt 100.8 lb

## 2023-08-31 DIAGNOSIS — I340 Nonrheumatic mitral (valve) insufficiency: Secondary | ICD-10-CM | POA: Diagnosis not present

## 2023-08-31 DIAGNOSIS — I351 Nonrheumatic aortic (valve) insufficiency: Secondary | ICD-10-CM | POA: Insufficient documentation

## 2023-08-31 DIAGNOSIS — J441 Chronic obstructive pulmonary disease with (acute) exacerbation: Secondary | ICD-10-CM | POA: Insufficient documentation

## 2023-08-31 DIAGNOSIS — I739 Peripheral vascular disease, unspecified: Secondary | ICD-10-CM | POA: Diagnosis not present

## 2023-08-31 DIAGNOSIS — I10 Essential (primary) hypertension: Secondary | ICD-10-CM | POA: Diagnosis not present

## 2023-08-31 NOTE — Patient Instructions (Signed)
Medication Instructions:  No changes *If you need a refill on your cardiac medications before your next appointment, please call your pharmacy*   Lab Work: No labs   Testing/Procedures: No testing   Follow-Up: At [facility], you and your health needs are our priority.  As part of our continuing mission to provide you with exceptional heart care, we have created designated Provider Care Teams.  These Care Teams include your primary Cardiologist (physician) and Advanced Practice Providers (APPs -  Physician Assistants and Nurse Practitioners) who all work together to provide you with the care you need, when you need it.  We recommend signing up for the patient portal called "MyChart".  Sign up information is provided on this After Visit Summary.  MyChart is used to connect with patients for Virtual Visits (Telemedicine).  Patients are able to view lab/test results, encounter notes, upcoming appointments, etc.  Non-urgent messages can be sent to your provider as well.   To learn more about what you can do with MyChart, go to [email or web address].    Your next appointment:   1 year(s)  Provider:   Robet Leu, PA-C

## 2023-09-05 DIAGNOSIS — Z23 Encounter for immunization: Secondary | ICD-10-CM | POA: Diagnosis not present

## 2023-09-05 DIAGNOSIS — R54 Age-related physical debility: Secondary | ICD-10-CM | POA: Diagnosis not present

## 2023-09-05 DIAGNOSIS — F3341 Major depressive disorder, recurrent, in partial remission: Secondary | ICD-10-CM | POA: Diagnosis not present

## 2023-09-05 DIAGNOSIS — S46111A Strain of muscle, fascia and tendon of long head of biceps, right arm, initial encounter: Secondary | ICD-10-CM | POA: Diagnosis not present

## 2023-09-05 DIAGNOSIS — M4726 Other spondylosis with radiculopathy, lumbar region: Secondary | ICD-10-CM | POA: Diagnosis not present

## 2023-09-05 DIAGNOSIS — M5412 Radiculopathy, cervical region: Secondary | ICD-10-CM | POA: Diagnosis not present

## 2023-09-06 DIAGNOSIS — S46211A Strain of muscle, fascia and tendon of other parts of biceps, right arm, initial encounter: Secondary | ICD-10-CM | POA: Diagnosis not present

## 2023-09-06 DIAGNOSIS — S46111A Strain of muscle, fascia and tendon of long head of biceps, right arm, initial encounter: Secondary | ICD-10-CM | POA: Diagnosis not present

## 2023-09-07 ENCOUNTER — Ambulatory Visit: Payer: Medicare Other | Admitting: Pulmonary Disease

## 2023-09-14 DIAGNOSIS — F4323 Adjustment disorder with mixed anxiety and depressed mood: Secondary | ICD-10-CM | POA: Diagnosis not present

## 2023-09-21 DIAGNOSIS — Z961 Presence of intraocular lens: Secondary | ICD-10-CM | POA: Diagnosis not present

## 2023-09-21 DIAGNOSIS — H52203 Unspecified astigmatism, bilateral: Secondary | ICD-10-CM | POA: Diagnosis not present

## 2023-09-21 DIAGNOSIS — H353132 Nonexudative age-related macular degeneration, bilateral, intermediate dry stage: Secondary | ICD-10-CM | POA: Diagnosis not present

## 2023-10-14 DIAGNOSIS — F4323 Adjustment disorder with mixed anxiety and depressed mood: Secondary | ICD-10-CM | POA: Diagnosis not present

## 2023-10-18 DIAGNOSIS — N959 Unspecified menopausal and perimenopausal disorder: Secondary | ICD-10-CM | POA: Diagnosis not present

## 2023-10-18 DIAGNOSIS — Z681 Body mass index (BMI) 19 or less, adult: Secondary | ICD-10-CM | POA: Diagnosis not present

## 2023-10-18 DIAGNOSIS — Z1231 Encounter for screening mammogram for malignant neoplasm of breast: Secondary | ICD-10-CM | POA: Diagnosis not present

## 2023-10-18 DIAGNOSIS — N952 Postmenopausal atrophic vaginitis: Secondary | ICD-10-CM | POA: Diagnosis not present

## 2023-10-18 DIAGNOSIS — Z01419 Encounter for gynecological examination (general) (routine) without abnormal findings: Secondary | ICD-10-CM | POA: Diagnosis not present

## 2023-11-14 DIAGNOSIS — F4323 Adjustment disorder with mixed anxiety and depressed mood: Secondary | ICD-10-CM | POA: Diagnosis not present

## 2023-11-22 DIAGNOSIS — I10 Essential (primary) hypertension: Secondary | ICD-10-CM | POA: Diagnosis not present

## 2023-11-22 DIAGNOSIS — J449 Chronic obstructive pulmonary disease, unspecified: Secondary | ICD-10-CM | POA: Diagnosis not present

## 2023-11-22 DIAGNOSIS — E46 Unspecified protein-calorie malnutrition: Secondary | ICD-10-CM | POA: Diagnosis not present

## 2023-12-01 DIAGNOSIS — S51859A Open bite of unspecified forearm, initial encounter: Secondary | ICD-10-CM | POA: Diagnosis not present

## 2023-12-18 DIAGNOSIS — K080 Exfoliation of teeth due to systemic causes: Secondary | ICD-10-CM | POA: Diagnosis not present

## 2023-12-21 DIAGNOSIS — K219 Gastro-esophageal reflux disease without esophagitis: Secondary | ICD-10-CM | POA: Diagnosis not present

## 2023-12-21 DIAGNOSIS — R194 Change in bowel habit: Secondary | ICD-10-CM | POA: Diagnosis not present

## 2023-12-21 DIAGNOSIS — K5730 Diverticulosis of large intestine without perforation or abscess without bleeding: Secondary | ICD-10-CM | POA: Diagnosis not present

## 2023-12-21 DIAGNOSIS — Z1211 Encounter for screening for malignant neoplasm of colon: Secondary | ICD-10-CM | POA: Diagnosis not present

## 2023-12-27 DIAGNOSIS — M25511 Pain in right shoulder: Secondary | ICD-10-CM | POA: Diagnosis not present

## 2023-12-31 DIAGNOSIS — Z1211 Encounter for screening for malignant neoplasm of colon: Secondary | ICD-10-CM | POA: Diagnosis not present

## 2023-12-31 DIAGNOSIS — Z1212 Encounter for screening for malignant neoplasm of rectum: Secondary | ICD-10-CM | POA: Diagnosis not present

## 2024-01-03 DIAGNOSIS — K080 Exfoliation of teeth due to systemic causes: Secondary | ICD-10-CM | POA: Diagnosis not present

## 2024-01-10 DIAGNOSIS — M25511 Pain in right shoulder: Secondary | ICD-10-CM | POA: Diagnosis not present

## 2024-01-12 ENCOUNTER — Telehealth (INDEPENDENT_AMBULATORY_CARE_PROVIDER_SITE_OTHER): Payer: Self-pay | Admitting: Physician Assistant

## 2024-01-12 NOTE — Telephone Encounter (Signed)
 Confirmed appt and address with patient for 01/15/2024.

## 2024-01-15 ENCOUNTER — Encounter (INDEPENDENT_AMBULATORY_CARE_PROVIDER_SITE_OTHER): Payer: Self-pay

## 2024-01-15 ENCOUNTER — Ambulatory Visit (INDEPENDENT_AMBULATORY_CARE_PROVIDER_SITE_OTHER): Payer: Medicare Other | Admitting: Physician Assistant

## 2024-01-15 VITALS — BP 128/71 | HR 83 | Ht 63.0 in | Wt 102.0 lb

## 2024-01-15 DIAGNOSIS — S02609D Fracture of mandible, unspecified, subsequent encounter for fracture with routine healing: Secondary | ICD-10-CM

## 2024-01-15 NOTE — Progress Notes (Unsigned)
 Dear Dr. Chales Salmon, Here is my assessment for our mutual patient, Kim Higgins. Thank you for allowing me the opportunity to care for your patient. Please do not hesitate to contact me should you have any other questions. Sincerely, Burna Forts PA-C  Otolaryngology Clinic Note Referring provider: Dr. Chales Salmon HPI:  Kim Higgins is a 78 y.o. female kindly referred by Dr. Chales Salmon   The patient is a 78 year old female seen in our office for follow-up evaluation status post open reduction internal fixation of mandibular fracture and maxillary mandibular fixation of comminuted displaced fractures of the mandible body by Dr. Jearld Fenton on 01/29/2023.  Patient has been followed in our office since surgery.  She ended up having the plate removed in August.  She has followed up outpatient with Dr. Vear Clock of dentistry and also Dr. Kenney Houseman of oral surgery.  She notes she continues to have malocclusion.  After discussion with oral surgery and dentistry they have decided that the best solution would be to extract 7 teeth followed by implants.  She notes that this process would cost approximately $50,000.  Additionally she notes she has some numbness along the lower lip as well that has been present since the injury.   Independent Review of Additional Tests or Records:  none   PMH/Meds/All/SocHx/FamHx/ROS:   Past Medical History:  Diagnosis Date   Anxiety    Arthritis    COPD (chronic obstructive pulmonary disease) (HCC)    Depression    situational   GERD (gastroesophageal reflux disease)    Hypertension    Pneumonia 11/2021   PVD (peripheral vascular disease) (HCC)    asymptomatic, mild LSCA stenosis   Thyroid nodule    begnin   Tobacco abuse    discontinued     Past Surgical History:  Procedure Laterality Date   CAROTID DUPLEX  05/24/10   RIGHT & LEFT ICA=NO DIAMETER REDUCTION. ORGIN OF LEFT CCA=EQUAL TO OR LESS THAN 70% REDUCTION. ORGIN OF LEFT SUCLAVIIAN=70-99% REDUCTION. LEFT  VERTEBRAL=TO-FRO FLOW NOTED.   MANDIBULAR HARDWARE REMOVAL N/A 07/07/2023   Procedure: MANDIBULAR HARDWARE REMOVAL;  Surgeon: [name], MD;  Location: [facility] OR;  Service: ENT;  Laterality: N/A;   MYOCARDICAL PERFUSION STUDY  06/23/06   NORMAL PATTREN OF PERFUSION IN ALL REGIONS. EF% NOT ESTIMATED SECONDARY TO INABILITY TO GATE.   ORIF MANDIBULAR FRACTURE N/A 01/29/2023   Procedure: OPEN REDUCTION INTERNAL FIXATION (ORIF) MANDIBULAR FRACTURE;  Surgeon: [name], MD;  Location: [facility] OR;  Service: ENT;  Laterality: N/A;   TRANSTHORACIC ECHOCARDIOGRAM  03/14/02   EF 60%, MILDLY THICKENED AORTIC VALVE WITH NO STENOSIS. MILD MR. OBSTRUCTION OF TRICUSPID VALVE WITH MILD TR. NORMAL RV SIZE AND FUNCTION.   UPPER ARTERIAL EXAM  05/27/11   LEFT PROXIMAL SUBCLAVIN ARTERY: SL ELEVATED VELOCITIES SUGGESTIVE OF 50% SIGNIFICANT DIAMETER REDUCTION.    Family History  Problem Relation Age of Onset   Hypertension Father    CAD Brother    Heart attack Paternal Uncle    Heart attack Paternal Grandfather      Social Connections: Not on file      Current Outpatient Medications:    B Complex-C (SUPER B COMPLEX PO), Take 1 tablet by mouth daily., Disp: , Rfl:    buPROPion (WELLBUTRIN XL) 300 MG 24 hr tablet, Take 300 mg by mouth daily., Disp: , Rfl:    busPIRone (BUSPAR) 5 MG tablet, Take 1 tablet (5 mg total) by mouth 3 (three) times daily., Disp: 270 tablet, Rfl: 1   famotidine (PEPCID) 20 MG  tablet, Take 20 mg by mouth at bedtime., Disp: , Rfl:    feeding supplement (BOOST HIGH PROTEIN) LIQD, Take 1 Container by mouth 2 (two) times daily between meals., Disp: , Rfl:    fluticasone-salmeterol (ADVAIR) 250-50 MCG/ACT AEPB, Inhale 1 puff into the lungs every 12 (twelve) hours., Disp: 60 each, Rfl: 6   Gelatin 600 MG CAPS, Take 600 mg by mouth daily., Disp: , Rfl:    irbesartan (AVAPRO) 150 MG tablet, TAKE 1 TABLET(150 MG) BY MOUTH DAILY (Patient taking differently: Take 150 mg by mouth daily.), Disp: 90 tablet,  Rfl: 0   MAGNESIUM CITRATE PO, Take 600 mg by mouth at bedtime. 300 mg each for RLL, Disp: , Rfl:    sertraline (ZOLOFT) 50 MG tablet, Take 0.5 tablets by mouth daily., Disp: , Rfl:    TURMERIC-GINGER PO, Take 1 tablet by mouth every other day. gummy, Disp: , Rfl:    albuterol (PROVENTIL) (2.5 MG/3ML) 0.083% nebulizer solution, Take 2.5 mg by nebulization every 2 (two) hours as needed for wheezing or shortness of breath. (Patient not taking: Reported on 01/15/2024), Disp: , Rfl:    benzonatate (TESSALON) 200 MG capsule, Take 1 capsule (200 mg total) by mouth 3 (three) times daily as needed for cough., Disp: 30 capsule, Rfl: 2   chlorhexidine (PERIDEX) 0.12 % solution, Use as directed 15 mLs in the mouth or throat 2 (two) times daily. (Patient taking differently: Use as directed 15 mLs in the mouth or throat at bedtime.), Disp: 120 mL, Rfl: 0   Milk Thistle 1000 MG CAPS, Take 1,000 mg by mouth daily., Disp: , Rfl:    Polyethylene Glycol 400 (BLINK TEARS) 0.25 % SOLN, Place 1 Drop/kg into both eyes daily as needed (eye lubricant)., Disp: , Rfl:    Probiotic CHEW, Chew 2 tablets by mouth daily., Disp: , Rfl:    Vitamin D-Vitamin K (VITAMIN K2-VITAMIN D3 PO), Take 1 tablet by mouth daily. Vit D3 5000 mg Vit K2 100 mg, Disp: , Rfl:    Physical Exam:   BP 128/71 (BP Location: Right Arm, Patient Position: Sitting, Cuff Size: Normal)   Pulse 83   Ht 5\' 3"  (1.6 m)   Wt 102 lb (46.3 kg)   SpO2 93%   BMI 18.07 kg/m   Pertinent Findings   Face is atraumatic Decreased sensation over the right anterior lateral jaw and lip Malocclusion of the jaw with overbite No lesions within the oral cavity No pain with palpation of the jaw   Seprately Identifiable Procedures:  None  Impression & Plans:  [name] is a 78 y.o. female with the following   Mandibular fracture-  Patient presenting today for follow-up evaluation of mandibular fracture.  She is requesting to see Dr. Jearld Fenton, uncertain as to why  our front office denied her request to see him.  She has some specific questions for him and would like to see him, he is agreed to see her in the office at his next available appointment.  Our office will reach out to her to schedule this.   - f/u With Dr. Jearld Fenton within the next two weeks   Thank you for allowing me the opportunity to care for your patient. Please do not hesitate to contact me should you have any other questions.  Sincerely, Burna Forts PA-C [facility] Phone: [phone] Fax: [phone]  01/15/2024, 1:16 PM

## 2024-01-24 DIAGNOSIS — M25511 Pain in right shoulder: Secondary | ICD-10-CM | POA: Diagnosis not present

## 2024-01-29 ENCOUNTER — Ambulatory Visit (INDEPENDENT_AMBULATORY_CARE_PROVIDER_SITE_OTHER): Admitting: Otolaryngology

## 2024-01-29 ENCOUNTER — Encounter (INDEPENDENT_AMBULATORY_CARE_PROVIDER_SITE_OTHER): Payer: Self-pay | Admitting: Otolaryngology

## 2024-01-29 VITALS — BP 148/73 | HR 84

## 2024-01-29 DIAGNOSIS — S02609D Fracture of mandible, unspecified, subsequent encounter for fracture with routine healing: Secondary | ICD-10-CM

## 2024-01-29 DIAGNOSIS — M264 Malocclusion, unspecified: Secondary | ICD-10-CM | POA: Diagnosis not present

## 2024-01-29 NOTE — Progress Notes (Signed)
 Kim Higgins is an 78 y.o. female.   Chief Complaint: malocclusion HPI: hx of complex mandible fracture and has been seeing oral surgery and dentist. She is still having some numbness of the lip. She has been recommended to have dental implants to fix her teeth and malooclusion.   Past Medical History:  Diagnosis Date   Anxiety    Arthritis    COPD (chronic obstructive pulmonary disease) (HCC)    Depression    situational   GERD (gastroesophageal reflux disease)    Hypertension    Pneumonia 11/2021   PVD (peripheral vascular disease) (HCC)    asymptomatic, mild LSCA stenosis   Thyroid nodule    begnin   Tobacco abuse    discontinued    Past Surgical History:  Procedure Laterality Date   CAROTID DUPLEX  05/24/10   RIGHT & LEFT ICA=NO DIAMETER REDUCTION. ORGIN OF LEFT CCA=EQUAL TO OR LESS THAN 70% REDUCTION. ORGIN OF LEFT SUCLAVIIAN=70-99% REDUCTION. LEFT VERTEBRAL=TO-FRO FLOW NOTED.   MANDIBULAR HARDWARE REMOVAL N/A 07/07/2023   Procedure: MANDIBULAR HARDWARE REMOVAL;  Surgeon: [name], MD;  Location: [facility] OR;  Service: ENT;  Laterality: N/A;   MYOCARDICAL PERFUSION STUDY  06/23/06   NORMAL PATTREN OF PERFUSION IN ALL REGIONS. EF% NOT ESTIMATED SECONDARY TO INABILITY TO GATE.   ORIF MANDIBULAR FRACTURE N/A 01/29/2023   Procedure: OPEN REDUCTION INTERNAL FIXATION (ORIF) MANDIBULAR FRACTURE;  Surgeon: [name], MD;  Location: [facility] OR;  Service: ENT;  Laterality: N/A;   TRANSTHORACIC ECHOCARDIOGRAM  03/14/02   EF 60%, MILDLY THICKENED AORTIC VALVE WITH NO STENOSIS. MILD MR. OBSTRUCTION OF TRICUSPID VALVE WITH MILD TR. NORMAL RV SIZE AND FUNCTION.   UPPER ARTERIAL EXAM  05/27/11   LEFT PROXIMAL SUBCLAVIN ARTERY: SL ELEVATED VELOCITIES SUGGESTIVE OF 50% SIGNIFICANT DIAMETER REDUCTION.    Family History  Problem Relation Age of Onset   Hypertension Father    CAD Brother    Heart attack Paternal Uncle    Heart attack Paternal Grandfather    Social History:  reports that she quit  smoking about 39 years ago. Her smoking use included cigarettes. She has never used smokeless tobacco. She reports that she does not currently use alcohol after a past usage of about 4.0 standard drinks of alcohol per week. She reports that she does not currently use drugs.  Allergies:  Allergies  Allergen Reactions   Amoxicillin Diarrhea   Augmentin [Amoxicillin-Pot Clavulanate] Diarrhea   Grass Pollen(K-O-R-T-Swt Vern)    Nystatin Diarrhea and Rash    (Not in a hospital admission)   No results found for this or any previous visit (from the past 48 hours). No results found.   Blood pressure (!) 148/73, pulse 84, SpO2 96%.  PHYSICAL EXAM: Appearance _ awake alert with no distress.  Head- atraumatic and no obvious abnormalities Eyes- PERRL, EOMI, no conjunctiva injection or ecchymosis Ears-  Right- Pinna without inflammation or swelling. canal without obstruction or injury. TM within normal limits  Left- Pinna without inflammation or swelling. canal without obstruction or injury. TM within normal limits Nose- no lesions or masses.  Oc/OP- still with teeth issues and malloclusion. No painno lesions or excessive swelling. Mouth opening normal.  Hp/Larynx- normal voice and no airway issues. No swelling or lesions Neck- no mass or lesions. Normal movement.  Neuro- CNII-XII intact, no sensory deficits.  Lungs- normal effort no distress noted     Assessment/Plan Malocclusion- she has significant need to finish the issues that are the result of her injury. She  had a very complex comminuted  fracture that has healed by pulling the teeth together. She needs oral surgery and dental intervention to fix these issues that are directly from the degree of injury. Will try to get medicare to help her with the expense of the continued medical needs.  [name] 01/29/2024, 10:32 AM

## 2024-01-31 DIAGNOSIS — M25511 Pain in right shoulder: Secondary | ICD-10-CM | POA: Diagnosis not present

## 2024-02-07 DIAGNOSIS — M25511 Pain in right shoulder: Secondary | ICD-10-CM | POA: Diagnosis not present

## 2024-02-21 ENCOUNTER — Encounter: Payer: Self-pay | Admitting: Pulmonary Disease

## 2024-02-22 DIAGNOSIS — K080 Exfoliation of teeth due to systemic causes: Secondary | ICD-10-CM | POA: Diagnosis not present

## 2024-02-27 MED ORDER — FLUTICASONE-SALMETEROL 250-50 MCG/ACT IN AEPB: 1.0000 | INHALATION_SPRAY | Freq: Two times a day (BID) | RESPIRATORY_TRACT | 1 refills | Status: DC

## 2024-02-28 ENCOUNTER — Institutional Professional Consult (permissible substitution) (INDEPENDENT_AMBULATORY_CARE_PROVIDER_SITE_OTHER): Payer: Medicare Other | Admitting: Otolaryngology

## 2024-04-25 ENCOUNTER — Other Ambulatory Visit: Payer: Self-pay | Admitting: Pulmonary Disease

## 2024-04-25 ENCOUNTER — Ambulatory Visit: Admitting: Pulmonary Disease

## 2024-04-25 VITALS — BP 103/62 | HR 85 | Ht 63.0 in | Wt 100.6 lb

## 2024-04-25 DIAGNOSIS — J449 Chronic obstructive pulmonary disease, unspecified: Secondary | ICD-10-CM | POA: Diagnosis not present

## 2024-04-25 MED ORDER — FLUTICASONE-SALMETEROL 250-50 MCG/ACT IN AEPB: 1.0000 | INHALATION_SPRAY | Freq: Two times a day (BID) | RESPIRATORY_TRACT | 5 refills | Status: DC

## 2024-04-25 NOTE — Addendum Note (Signed)
 Addended by: Lonie Roa on: 04/25/2024 02:13 PM   Modules accepted: Orders

## 2024-04-25 NOTE — Progress Notes (Signed)
 Kim Higgins    [id number]    [birth year]  Primary Care Physician:Varadarajan, Willena Harp, MD  Referring Physician: Arva Lathe, MD [address] [place],  [place] [address]  Chief complaint:   Patient being seen for COPD In for follow-up  HPI: Has been doing relatively well  Getting back to getting more active  Was able to put some weight on  Going to the gym, walking on a regular basis  Breathing has been steady on Advair 250  Less shortness of breath overall  Outpatient Encounter Medications as of 04/25/2024  Medication Sig   albuterol  (PROVENTIL ) (2.5 MG/3ML) 0.083% nebulizer solution Take 2.5 mg by nebulization every 2 (two) hours as needed for wheezing or shortness of breath.   B Complex-C (SUPER B COMPLEX PO) Take 1 tablet by mouth daily.   benzonatate  (TESSALON ) 200 MG capsule Take 1 capsule (200 mg total) by mouth 3 (three) times daily as needed for cough.   buPROPion  (WELLBUTRIN  XL) 300 MG 24 hr tablet Take 300 mg by mouth daily.   busPIRone  (BUSPAR ) 5 MG tablet Take 1 tablet (5 mg total) by mouth 3 (three) times daily.   chlorhexidine  (PERIDEX ) 0.12 % solution Use as directed 15 mLs in the mouth or throat 2 (two) times daily. (Patient taking differently: Use as directed 15 mLs in the mouth or throat at bedtime.)   famotidine  (PEPCID ) 20 MG tablet Take 20 mg by mouth at bedtime.   feeding supplement (BOOST HIGH PROTEIN) LIQD Take 1 Container by mouth 2 (two) times daily between meals.   fluticasone -salmeterol (ADVAIR) 250-50 MCG/ACT AEPB Inhale 1 puff into the lungs every 12 (twelve) hours.   Gelatin 600 MG CAPS Take 600 mg by mouth daily.   irbesartan  (AVAPRO ) 150 MG tablet TAKE 1 TABLET(150 MG) BY MOUTH DAILY (Patient taking differently: Take 150 mg by mouth daily.)   Polyethylene Glycol 400 (BLINK TEARS) 0.25 % SOLN Place 1 Drop/kg into both eyes daily as needed (eye lubricant).   Probiotic CHEW Chew 2 tablets by mouth daily.    sertraline (ZOLOFT) 50 MG tablet Take 0.5 tablets by mouth daily.   Vitamin D-Vitamin K (VITAMIN K2-VITAMIN D3 PO) Take 1 tablet by mouth daily. Vit D3 5000 mg Vit K2 100 mg   MAGNESIUM CITRATE PO Take 600 mg by mouth at bedtime. 300 mg each for RLL (Patient not taking: Reported on 04/25/2024)   Milk Thistle 1000 MG CAPS Take 1,000 mg by mouth daily.   TURMERIC-GINGER PO Take 1 tablet by mouth every other day. gummy   No facility-administered encounter medications on file as of 04/25/2024.    Allergies as of 04/25/2024 - Review Complete 04/25/2024  Allergen Reaction Noted   Amoxicillin  Diarrhea 09/05/2022   Augmentin  [amoxicillin -pot clavulanate] Diarrhea 01/28/2023   Grass pollen(k-o-r-t-swt vern)  03/03/2023   Nystatin Diarrhea and Rash 07/14/2015    Past Medical History:  Diagnosis Date   Anxiety    Arthritis    COPD (chronic obstructive pulmonary disease) (HCC)    Depression    situational   GERD (gastroesophageal reflux disease)    Hypertension    Pneumonia 11/2021   PVD (peripheral vascular disease) (HCC)    asymptomatic, mild LSCA stenosis   Thyroid  nodule    begnin   Tobacco abuse    discontinued    Past Surgical History:  Procedure Laterality Date   CAROTID DUPLEX  05/24/10  RIGHT & LEFT ICA=NO DIAMETER REDUCTION. ORGIN OF LEFT CCA=EQUAL TO OR LESS THAN 70% REDUCTION. ORGIN OF LEFT SUCLAVIIAN=70-99% REDUCTION. LEFT VERTEBRAL=TO-FRO FLOW NOTED.   MANDIBULAR HARDWARE REMOVAL N/A 07/07/2023   Procedure: MANDIBULAR HARDWARE REMOVAL;  Surgeon: Vernadine Golas, MD;  Location: [facility] OR;  Service: ENT;  Laterality: N/A;   MYOCARDICAL PERFUSION STUDY  06/23/06   NORMAL PATTREN OF PERFUSION IN ALL REGIONS. EF% NOT ESTIMATED SECONDARY TO INABILITY TO GATE.   ORIF MANDIBULAR FRACTURE N/A 01/29/2023   Procedure: OPEN REDUCTION INTERNAL FIXATION (ORIF) MANDIBULAR FRACTURE;  Surgeon: Vernadine Golas, MD;  Location: [facility] OR;  Service: ENT;  Laterality: N/A;   TRANSTHORACIC ECHOCARDIOGRAM   03/14/02   EF 60%, MILDLY THICKENED AORTIC VALVE WITH NO STENOSIS. MILD MR. OBSTRUCTION OF TRICUSPID VALVE WITH MILD TR. NORMAL RV SIZE AND FUNCTION.   UPPER ARTERIAL EXAM  05/27/11   LEFT PROXIMAL SUBCLAVIN ARTERY: SL ELEVATED VELOCITIES SUGGESTIVE OF 50% SIGNIFICANT DIAMETER REDUCTION.    Family History  Problem Relation Age of Onset   Hypertension Father    CAD Brother    Heart attack Paternal Uncle    Heart attack Paternal Grandfather     Social History   Socioeconomic History   Marital status: Divorced    Spouse name: Not on file   Number of children: Not on file   Years of education: Not on file   Highest education level: Not on file  Occupational History   Not on file  Tobacco Use   Smoking status: Former    Current packs/day: 0.00    Types: Cigarettes    Quit date: 08/14/1984    Years since quitting: 39.7   Smokeless tobacco: Never  Vaping Use   Vaping status: Never Used  Substance and Sexual Activity   Alcohol use: Not Currently    Alcohol/week: 4.0 standard drinks of alcohol    Types: 4 Glasses of wine per week   Drug use: Not Currently   Sexual activity: Not on file  Other Topics Concern   Not on file  Social History Narrative   Not on file   Social Drivers of Health   Financial Resource Strain: Not on file  Food Insecurity: Not on file  Transportation Needs: Not on file  Physical Activity: Not on file  Stress: Not on file  Social Connections: Not on file  Intimate Partner Violence: Not on file    Review of Systems  Respiratory:  Positive for shortness of breath.     Vitals:   04/25/24 1356  BP: 103/62  Pulse: 85  SpO2: 93%     Physical Exam Constitutional:      Appearance: Normal appearance.  HENT:     Head: Normocephalic.     Mouth/Throat:     Mouth: Mucous membranes are moist.   Eyes:     General: No scleral icterus.    Pupils: Pupils are equal, round, and reactive to light.    Cardiovascular:     Rate and Rhythm: Normal rate  and regular rhythm.     Heart sounds: No murmur heard.    No friction rub.  Pulmonary:     Effort: No respiratory distress.     Breath sounds: No stridor. No wheezing or rhonchi.   Musculoskeletal:     Cervical back: No rigidity or tenderness.   Neurological:     Mental Status: She is alert.   Psychiatric:        Mood and Affect: Mood normal.    Data Reviewed:  Chest x-ray 01/29/2023-reviewed by myself No infiltrative process noted  No PFT on record  Assessment:   Chronic obstructive pulm disease  Shortness of breath on exertion  Anxiety  Plan/Recommendations:  Continue Advair 250  Albuterol  as needed Graded activities as tolerated  Follow-up in 6 months   Myer Artis MD [facility] 04/25/2024, 1:59 PM  CC: Arva Lathe,*

## 2024-04-25 NOTE — Patient Instructions (Signed)
 Follow-up in 6 months  Continue Advair 250  Call us  with significant concerns  Ensure you continue to stay active

## 2024-05-01 DIAGNOSIS — R350 Frequency of micturition: Secondary | ICD-10-CM | POA: Diagnosis not present

## 2024-05-01 DIAGNOSIS — K080 Exfoliation of teeth due to systemic causes: Secondary | ICD-10-CM | POA: Diagnosis not present

## 2024-05-01 DIAGNOSIS — I959 Hypotension, unspecified: Secondary | ICD-10-CM | POA: Diagnosis not present

## 2024-07-02 DIAGNOSIS — E46 Unspecified protein-calorie malnutrition: Secondary | ICD-10-CM | POA: Diagnosis not present

## 2024-07-02 DIAGNOSIS — J449 Chronic obstructive pulmonary disease, unspecified: Secondary | ICD-10-CM | POA: Diagnosis not present

## 2024-07-02 DIAGNOSIS — Z0000 Encounter for general adult medical examination without abnormal findings: Secondary | ICD-10-CM | POA: Diagnosis not present

## 2024-07-02 DIAGNOSIS — K219 Gastro-esophageal reflux disease without esophagitis: Secondary | ICD-10-CM | POA: Diagnosis not present

## 2024-07-02 DIAGNOSIS — I10 Essential (primary) hypertension: Secondary | ICD-10-CM | POA: Diagnosis not present

## 2024-07-02 DIAGNOSIS — Z136 Encounter for screening for cardiovascular disorders: Secondary | ICD-10-CM | POA: Diagnosis not present

## 2024-07-09 DIAGNOSIS — M542 Cervicalgia: Secondary | ICD-10-CM | POA: Diagnosis not present

## 2024-07-29 DIAGNOSIS — M542 Cervicalgia: Secondary | ICD-10-CM | POA: Diagnosis not present

## 2024-08-09 DIAGNOSIS — G8929 Other chronic pain: Secondary | ICD-10-CM | POA: Diagnosis not present

## 2024-08-09 DIAGNOSIS — M542 Cervicalgia: Secondary | ICD-10-CM | POA: Diagnosis not present

## 2024-08-12 DIAGNOSIS — G8929 Other chronic pain: Secondary | ICD-10-CM | POA: Diagnosis not present

## 2024-08-12 DIAGNOSIS — M542 Cervicalgia: Secondary | ICD-10-CM | POA: Diagnosis not present

## 2024-08-14 DIAGNOSIS — K080 Exfoliation of teeth due to systemic causes: Secondary | ICD-10-CM | POA: Diagnosis not present

## 2024-08-16 DIAGNOSIS — M542 Cervicalgia: Secondary | ICD-10-CM | POA: Diagnosis not present

## 2024-08-16 DIAGNOSIS — G8929 Other chronic pain: Secondary | ICD-10-CM | POA: Diagnosis not present

## 2024-08-19 DIAGNOSIS — M542 Cervicalgia: Secondary | ICD-10-CM | POA: Diagnosis not present

## 2024-08-21 DIAGNOSIS — M542 Cervicalgia: Secondary | ICD-10-CM | POA: Diagnosis not present

## 2024-08-26 DIAGNOSIS — M542 Cervicalgia: Secondary | ICD-10-CM | POA: Diagnosis not present

## 2024-08-26 DIAGNOSIS — G8929 Other chronic pain: Secondary | ICD-10-CM | POA: Diagnosis not present

## 2024-08-28 DIAGNOSIS — M542 Cervicalgia: Secondary | ICD-10-CM | POA: Diagnosis not present

## 2024-09-10 ENCOUNTER — Telehealth: Payer: Self-pay | Admitting: Cardiovascular Disease

## 2024-09-10 NOTE — Telephone Encounter (Signed)
 Spoke with pt regarding recent dizziness that she is experiencing. Pt states this happened before and Dr. Court did multiple tests to figure out what was going on such as a monitor, carotid dopplers, and echo. Pt states that she never had an episode while wearing the monitor and didn't really have additional episodes. Pt states that she has had 2 episodes recently one last Thursday and one yesterday. She says they only last for a few seconds and are accompanied with lightheadedness. Pt states that she doesn't typically check her blood pressure until she spoke with our answering team. Her blood pressure today has been 160/78, 152/72, 168/74 in her right arm. Pt states that back on 8/19 she saw her PCP and had a low blood pressure (116/76) and was instructed to reduce irbesartan  to 75mg  daily, pt slowly transitioned because she had a large supply of 150mg  tablets. Pt says she eats pretty well however, she might not be drinking enough water. Pt was last seen in the office on 08/31/23, advised pt that we would need to see her back in the office to assess and better advise. Appointment scheduled for tomorrow at 10:55am. Pt verbalizes understanding.

## 2024-09-10 NOTE — Progress Notes (Unsigned)
 Cardiology Office Note    Date:  09/11/2024  ID:  Kim Higgins, DOB Sep 09, 1946, MRN [id number] PCP:  Elliot Charm, MD  Cardiologist:  Dorn Lesches, MD  Electrophysiologist:  None   Chief Complaint: Dizziness   History of Present Illness: .    Kim Higgins is a 78 y.o. female with visit-pertinent history of hypertension, history of tobacco use, mild left subclavian artery stenosis, COPD.  Patient is followed by Dr. Lesches and presents today for increased dizziness.   Per chart review, patient previously wore a cardiac monitor in 04/2018 that showed sinus rhythm, a short run of NSVT, occasional PVCs.  Carotid Dopplers in 06/2021 showed 1-39% stenosis in bilateral ICAs normal flow hemodynamics were seen in bilateral subclavian arteries. There was a 25 mmHg difference from right to left brachial pressures.  Echocardiogram on 06/27/2022 showed EF 55-60%, no regional wall motion abnormalities, mild LVH, normal RV function, mild MR, mild-moderate aortic valve regurgitation.   Patient was last seen by Dr. Lesches on 08/03/2022.  At that time, patient denied chest pain or shortness of breath.  She reported occasional dizziness, but it was not frequent.  She underwent her annual carotid Doppler on 07/14/2023 that showed 1-39% stenosis in bilateral ICAs, normal flow hemodynamics in bilateral subclavian arteries.  She was last seen in clinic on 08/31/2023 by Rollo Louder, PA.  She reported that she been recovering from a significant accident in March 2024, she was walking and tripped, falling face first into the ground resulting in a broken jaw, scraped shin and dental injuries resulting in emergency surgery and a month of jaw immobilization.  She had had significant weight loss down to 93 pounds.  Reported that her COPD symptoms including shortness of breath remained stable.  Today she presents regarding increased episodes of dizziness.  She reports that for the last 2 weeks she has had increased  episodes of dizzy spells, denies any true syncope.  She is unsure if she feels as though she may pass out.  She denies any significant chest pain, notes some mild shortness of breath, denies any increased lower extremity edema, orthopnea or PND.  She is unsure if she is having palpitations during episodes.  She does report that she has had significant stressors in the last few weeks, she recently sold her house and asked to urgently move, her brother was also diagnosed with lung cancer.  Patient notes that she previously has had these episodes of dizzy spells also in times of stress however prior workup was negative, she feels that these have worsened in the last 2 weeks and became increasingly concerned.  She does also report that after her fall last year she has significant arthritis in her neck as well as constant headaches.  She reports that her PCP decreased her irbesartan  earlier this month, has not been regularly checking her blood pressure at home however notes that yesterday her blood pressure was significantly elevated.  Labwork independently reviewed: 07/02/2024: Hemoglobin 13.6, hematocrit 40.2, creatinine 0.78, sodium 136, potassium 5.1, AST 30, ALT 30, total cholesterol 190, HDL 110, triglycerides 44 and LDL 71. ROS: .   Today she denies chest pain, lower extremity edema, fatigue, melena, hematuria, hemoptysis, diaphoresis, weakness, syncope, orthopnea, and PND.  All other systems are reviewed and otherwise negative. Studies Reviewed: SABRA   EKG:  EKG is ordered today, personally reviewed, demonstrating  EKG Interpretation Date/Time:  Wednesday September 11 2024 11:13:42 EDT Ventricular Rate:  80 PR Interval:  138 QRS Duration:  80 QT Interval:  368 QTC Calculation: 424 R Axis:   58  Text Interpretation: Normal sinus rhythm Normal ECG When compared with ECG of 31-Aug-2023 15:50, No significant change was found Confirmed by Marsia Cino [phone]) on 09/11/2024 5:44:49 PM   CV Studies:  Cardiac studies reviewed are outlined and summarized above. Otherwise please see EMR for full report. Cardiac Studies & Procedures   ______________________________________________________________________________________________   STRESS TESTS  NM MYOCAR MULTI W/SPECT W 06/21/2006   ECHOCARDIOGRAM  ECHOCARDIOGRAM COMPLETE 06/27/2022  Narrative ECHOCARDIOGRAM REPORT    Patient Name:   Kim Higgins Date of Exam: 06/27/2022 Medical Rec #:  [id number]     Height:       64.0 in Accession #:    [id number]    Weight:       110.8 lb Date of Birth:  05/05/1946      BSA:          1.522 m Patient Age:    76 years      BP:           121/73 mmHg Patient Gender: F             HR:           79 bpm. Exam Location:  [address]  Procedure: 2D Echo, Cardiac Doppler and Color Doppler  Indications:    R06.09 SOB  History:        Patient has prior history of Echocardiogram examinations, most recent 03/14/2002. Signs/Symptoms:Shortness of Breath; Risk Factors:Former Smoker and Hypertension.  Sonographer:    Elsie Bohr RDCS Referring Phys: [id number] RUPASHREE VARADARAJAN  IMPRESSIONS   1. Left ventricular ejection fraction, by estimation, is 55 to 60%. The left ventricle has normal function. The left ventricle has no regional wall motion abnormalities. There is mild concentric left ventricular hypertrophy. Left ventricular diastolic parameters were normal. 2. Right ventricular systolic function is normal. The right ventricular size is normal. There is normal pulmonary artery systolic pressure. 3. The mitral valve is normal in structure. Mild mitral valve regurgitation. No evidence of mitral stenosis. 4. The aortic valve is normal in structure. Aortic valve regurgitation is mild to moderate. Aortic valve sclerosis/calcification is present, without any evidence of aortic stenosis. 5. The inferior vena cava is dilated in size with >50% respiratory variability, suggesting right atrial pressure of 8  mmHg.  FINDINGS Left Ventricle: Left ventricular ejection fraction, by estimation, is 55 to 60%. The left ventricle has normal function. The left ventricle has no regional wall motion abnormalities. The left ventricular internal cavity size was normal in size. There is mild concentric left ventricular hypertrophy. Left ventricular diastolic parameters were normal.  Right Ventricle: The right ventricular size is normal. No increase in right ventricular wall thickness. Right ventricular systolic function is normal. There is normal pulmonary artery systolic pressure. The tricuspid regurgitant velocity is 2.55 m/s, and with an assumed right atrial pressure of 8 mmHg, the estimated right ventricular systolic pressure is 34.0 mmHg.  Left Atrium: Left atrial size was normal in size.  Right Atrium: Right atrial size was normal in size.  Pericardium: There is no evidence of pericardial effusion.  Mitral Valve: The mitral valve is normal in structure. Mild mitral valve regurgitation. No evidence of mitral valve stenosis.  Tricuspid Valve: The tricuspid valve is normal in structure. Tricuspid valve regurgitation is not demonstrated. No evidence of tricuspid stenosis.  Aortic Valve: The aortic valve is normal in structure. Aortic valve regurgitation is mild to moderate. Aortic valve  sclerosis/calcification is present, without any evidence of aortic stenosis.  Pulmonic Valve: The pulmonic valve was normal in structure. Pulmonic valve regurgitation is not visualized. No evidence of pulmonic stenosis.  Aorta: The aortic root is normal in size and structure.  Venous: The inferior vena cava is dilated in size with greater than 50% respiratory variability, suggesting right atrial pressure of 8 mmHg.  IAS/Shunts: No atrial level shunt detected by color flow Doppler.   LEFT VENTRICLE PLAX 2D LVIDd:         3.20 cm   Diastology LVIDs:         2.20 cm   LV e' medial:    9.79 cm/s LV PW:         1.00 cm    LV E/e' medial:  9.5 LV IVS:        1.10 cm   LV e' lateral:   21.10 cm/s LVOT diam:     1.60 cm   LV E/e' lateral: 4.4 LV SV:         50 LV SV Index:   33 LVOT Area:     2.01 cm   RIGHT VENTRICLE             IVC RV S prime:     12.70 cm/s  IVC diam: 2.30 cm TAPSE (M-mode): 2.4 cm RVSP:           29.0 mmHg  LEFT ATRIUM             Index        RIGHT ATRIUM           Index LA diam:        2.80 cm 1.84 cm/m   RA Pressure: 3.00 mmHg LA Vol (A2C):   27.0 ml 17.74 ml/m  RA Area:     8.82 cm LA Vol (A4C):   33.9 ml 22.27 ml/m  RA Volume:   18.80 ml  12.35 ml/m LA Biplane Vol: 33.0 ml 21.68 ml/m AORTIC VALVE LVOT Vmax:   115.00 cm/s LVOT Vmean:  76.100 cm/s LVOT VTI:    0.251 m  AORTA Ao Root diam: 2.70 cm Ao Asc diam:  2.80 cm  MITRAL VALVE               TRICUSPID VALVE MV Area (PHT): 4.52 cm    TR Peak grad:   26.0 mmHg MV Decel Time: 168 msec    TR Vmax:        255.00 cm/s MV E velocity: 92.80 cm/s  Estimated RAP:  3.00 mmHg MV A velocity: 84.90 cm/s  RVSP:           29.0 mmHg MV E/A ratio:  1.09 SHUNTS Systemic VTI:  0.25 m Systemic Diam: 1.60 cm  Kardie Tobb DO Electronically signed by Dub Huntsman DO Signature Date/Time: 06/27/2022/2:06:10 PM    Final    MONITORS  CARDIAC EVENT MONITOR 04/30/2018  Narrative 1. SR/SB/ST 2. Short run NSVT 3. Occasional PVCs       ______________________________________________________________________________________________       Current Reported Medications:.    Current Meds  Medication Sig   albuterol  (PROVENTIL ) (2.5 MG/3ML) 0.083% nebulizer solution Take 2.5 mg by nebulization every 2 (two) hours as needed for wheezing or shortness of breath.   B Complex-C (SUPER B COMPLEX PO) Take 1 tablet by mouth daily.   benzonatate  (TESSALON ) 200 MG capsule Take 1 capsule (200 mg total) by mouth 3 (three) times daily as needed for cough.  buPROPion  (WELLBUTRIN  XL) 300 MG 24 hr tablet Take 300 mg by mouth daily.    busPIRone  (BUSPAR ) 5 MG tablet Take 1 tablet (5 mg total) by mouth 3 (three) times daily.   chlorhexidine  (PERIDEX ) 0.12 % solution Use as directed 15 mLs in the mouth or throat 2 (two) times daily. (Patient taking differently: Use as directed 15 mLs in the mouth or throat at bedtime.)   famotidine  (PEPCID ) 20 MG tablet Take 20 mg by mouth at bedtime.   fluticasone -salmeterol (ADVAIR) 250-50 MCG/ACT AEPB Inhale 1 puff into the lungs every 12 (twelve) hours.   Gelatin 600 MG CAPS Take 600 mg by mouth daily.   irbesartan  (AVAPRO ) 150 MG tablet TAKE 1 TABLET(150 MG) BY MOUTH DAILY (Patient taking differently: Take 150 mg by mouth daily.)   MAGNESIUM CITRATE PO Take 600 mg by mouth at bedtime. 300 mg each for RLL   Milk Thistle 1000 MG CAPS Take 1,000 mg by mouth daily.   Polyethylene Glycol 400 (BLINK TEARS) 0.25 % SOLN Place 1 Drop/kg into both eyes daily as needed (eye lubricant).   Probiotic CHEW Chew 2 tablets by mouth daily.   sertraline (ZOLOFT) 50 MG tablet Take 0.5 tablets by mouth daily.    Physical Exam:    VS:  BP (!) 171/77 (BP Location: Left Arm, Patient Position: Sitting, Cuff Size: Normal)   Pulse 84   Resp 20   Ht 5' 3 (1.6 m)   Wt 99 lb 12.8 oz (45.3 kg)   SpO2 92%   BMI 17.68 kg/m    Wt Readings from Last 3 Encounters:  09/11/24 99 lb 12.8 oz (45.3 kg)  04/25/24 100 lb 9.6 oz (45.6 kg)  01/15/24 102 lb (46.3 kg)    GEN: Well nourished, well developed in no acute distress NECK: No JVD; No carotid bruits CARDIAC: RRR, no murmurs, rubs, gallops RESPIRATORY:  Clear to auscultation without rales, wheezing or rhonchi  ABDOMEN: Soft, non-tender, non-distended EXTREMITIES:  No edema; No acute deformity     Asessement and Plan:.    Dizziness: Patient presents today with episodes of increased dizziness, notes that she previously had these episodes and wore a cardiac monitor however while wearing the monitor did not have any episodes.  She denies any true syncope, is unsure  if she feels as though she is about to pass out and is unsure if she is having significant palpitations during episodes, notes it feels though her heart may be slightly racing and notes some mild DOE.  She notes that she is under significant amount of stress currently with moving and her brothers recent diagnosis of lung cancer.  She questions if this is possibly contributing however would like to undergo further evaluation as she is moving.  Orthostatics negative in office today however blood pressure is elevated.  Will check 2-week nonlive ZIO monitor, echocardiogram and carotid Dopplers as Doppler in 2012 showed 50% like a stenosis however follow-up imaging showed no evidence of stenosis.  Reviewed ED precautions. Check CBC and BMET today.   Mild MR/Moderate AI: Previously noted on echocardiogram in 06/2022.  She does note episodes of increased dizziness and some mild DOE as noted above.  Denies increased lower extremity edema, thought or PND.  Will check echocardiogram as noted above.  Hypertension: Initial blood pressure today 161/77, on recheck was 158/66.  Patient was not orthostatic in office today.  She notes that her PCP recently decreased her irbesartan  250 mg daily, she has not been monitoring her blood  pressure at home however notes that she has been under significant mount of stress in recent weeks.  Encouraged patient to increase back to 150 mg daily and continue to monitor her blood pressure at home.   Disposition: F/u with Lorrine Killilea, NP in 6-8 weeks.   Signed, Benisha Hadaway D Alnita Aybar, NP

## 2024-09-10 NOTE — Telephone Encounter (Signed)
 STAT if patient feels like he/she is going to faint   Are you dizzy, lightheaded, or faint now? No but yesterday afternoon she felt dizzy.   Have you passed out? No. IF YES MOVE TO .SYNCOPECVD  Do you have any other symptoms? Pt stated her balance was off yesterday   Have you checked your HR and BP (record if available)? No

## 2024-09-11 ENCOUNTER — Encounter: Payer: Self-pay | Admitting: Cardiology

## 2024-09-11 ENCOUNTER — Ambulatory Visit

## 2024-09-11 ENCOUNTER — Ambulatory Visit: Attending: Cardiology | Admitting: Cardiology

## 2024-09-11 VITALS — BP 171/77 | HR 84 | Resp 20 | Ht 63.0 in | Wt 99.8 lb

## 2024-09-11 DIAGNOSIS — I351 Nonrheumatic aortic (valve) insufficiency: Secondary | ICD-10-CM

## 2024-09-11 DIAGNOSIS — R42 Dizziness and giddiness: Secondary | ICD-10-CM

## 2024-09-11 DIAGNOSIS — I340 Nonrheumatic mitral (valve) insufficiency: Secondary | ICD-10-CM

## 2024-09-11 DIAGNOSIS — R002 Palpitations: Secondary | ICD-10-CM | POA: Diagnosis not present

## 2024-09-11 DIAGNOSIS — I10 Essential (primary) hypertension: Secondary | ICD-10-CM

## 2024-09-11 DIAGNOSIS — I739 Peripheral vascular disease, unspecified: Secondary | ICD-10-CM | POA: Diagnosis not present

## 2024-09-11 DIAGNOSIS — J441 Chronic obstructive pulmonary disease with (acute) exacerbation: Secondary | ICD-10-CM

## 2024-09-11 DIAGNOSIS — R0602 Shortness of breath: Secondary | ICD-10-CM | POA: Diagnosis not present

## 2024-09-11 NOTE — Progress Notes (Unsigned)
 Applied a 14 day [facility] monitor to patient in the office  Court to read

## 2024-09-11 NOTE — Patient Instructions (Signed)
 Medication Instructions:  Your physician recommends that you continue on your current medications as directed. Please refer to the Current Medication list given to you today.  *If you need a refill on your cardiac medications before your next appointment, please call your pharmacy*  Lab Work: Today-BMET, CBC If you have labs (blood work) drawn today and your tests are completely normal, you will receive your results only by: MyChart Message (if you have MyChart) OR A paper copy in the mail If you have any lab test that is abnormal or we need to change your treatment, we will call you to review the results.  Testing/Procedures: Your physician has requested that you have a carotid duplex. This test is an ultrasound of the carotid arteries in your neck. It looks at blood flow through these arteries that supply the brain with blood. Allow one hour for this exam. There are no restrictions or special instructions.    Your physician has requested that you have an echocardiogram. Echocardiography is a painless test that uses sound waves to create images of your heart. It provides your doctor with information about the size and shape of your heart and how well your heart's chambers and valves are working. This procedure takes approximately one hour. There are no restrictions for this procedure. Please do NOT wear cologne, perfume, aftershave, or lotions (deodorant is allowed). Please arrive 15 minutes prior to your appointment time.  Please note: We ask at that you not bring children with you during ultrasound (echo/ vascular) testing. Due to room size and safety concerns, children are not allowed in the ultrasound rooms during exams. Our front office staff cannot provide observation of children in our lobby area while testing is being conducted. An adult accompanying a patient to their appointment will only be allowed in the ultrasound room at the discretion of the ultrasound technician under special  circumstances. We apologize for any inconvenience.   ZIO XT- Long Term Monitor Instructions  Your physician has requested you wear a ZIO patch monitor for 14 days.  This is a single patch monitor. [employer] supplies one patch monitor per enrollment. Additional stickers are not available. Please do not apply patch if you will be having a Nuclear Stress Test,  Echocardiogram, Cardiac CT, MRI, or Chest Xray during the period you would be wearing the  monitor. The patch cannot be worn during these tests. You cannot remove and re-apply the  ZIO XT patch monitor.   Billing and Patient Assistance Program Information  We have supplied [employer] with any of your insurance information on file for billing purposes. [employer] offers a sliding scale Patient Assistance Program for patients that do not have  insurance, or whose insurance does not completely cover the cost of the ZIO monitor.  You must apply for the Patient Assistance Program to qualify for this discounted rate.  To apply, please call [employer] at [phone], select option 4, select option 2, ask to apply for  Patient Assistance Program. M[employer] will ask your household income, and how many people  are in your household. They will quote your out-of-pocket cost based on that information.  [employer] will also be able to set up a 74-month, interest-free payment plan if needed.  When you are ready to remove the patch, follow instructions on the last 2 pages of Patient  Logbook. Stick patch monitor onto the last page of Patient Logbook.  Place Patient Logbook in the blue and white box. Use locking tab on box and tape box closed  securely. The blue and white box has prepaid postage on it. Please place it in the mailbox as  soon as possible. Your physician should have your test results approximately 7 days after the  monitor has been mailed back to [facility].  Call [facility] Customer Care at [phone] if you have questions regarding  your  ZIO XT patch monitor. Call them immediately if you see an orange light blinking on your  monitor.  If your monitor falls off in less than 4 days, contact our Monitor department at [phone].  If your monitor becomes loose or falls off after 4 days call [employer] at [phone] for  suggestions on securing your monitor   Follow-Up: At [facility], you and your health needs are our priority.  As part of our continuing mission to provide you with exceptional heart care, our providers are all part of one team.  This team includes your primary Cardiologist (physician) and Advanced Practice Providers or APPs (Physician Assistants and Nurse Practitioners) who all work together to provide you with the care you need, when you need it.  Your next appointment:   8 week(s)  Provider:   Katlyn West, NP

## 2024-09-12 ENCOUNTER — Ambulatory Visit: Payer: Self-pay | Admitting: Cardiology

## 2024-09-12 LAB — CBC
Hematocrit: 41.2 % (ref 34.0–46.6)
Hemoglobin: 13.5 g/dL (ref 11.1–15.9)
MCH: 32.6 pg (ref 26.6–33.0)
MCHC: 32.8 g/dL (ref 31.5–35.7)
MCV: 100 fL — ABNORMAL HIGH (ref 79–97)
Platelets: 177 x10E3/uL (ref 150–450)
RBC: 4.14 x10E6/uL (ref 3.77–5.28)
RDW: 12.6 % (ref 11.7–15.4)
WBC: 8 x10E3/uL (ref 3.4–10.8)

## 2024-09-12 LAB — BASIC METABOLIC PANEL WITH GFR
BUN/Creatinine Ratio: 20 (ref 12–28)
BUN: 16 mg/dL (ref 8–27)
CO2: 26 mmol/L (ref 20–29)
Calcium: 10.4 mg/dL — AB (ref 8.7–10.3)
Chloride: 94 mmol/L — ABNORMAL LOW (ref 96–106)
Creatinine, Ser: 0.81 mg/dL (ref 0.57–1.00)
Glucose: 90 mg/dL (ref 70–99)
Potassium: 4.5 mmol/L (ref 3.5–5.2)
Sodium: 134 mmol/L (ref 134–144)
eGFR: 75 mL/min/1.73 (ref >59)

## 2024-09-23 DIAGNOSIS — M47812 Spondylosis without myelopathy or radiculopathy, cervical region: Secondary | ICD-10-CM | POA: Diagnosis not present

## 2024-09-23 DIAGNOSIS — M5481 Occipital neuralgia: Secondary | ICD-10-CM | POA: Diagnosis not present

## 2024-09-24 DIAGNOSIS — M5481 Occipital neuralgia: Secondary | ICD-10-CM | POA: Diagnosis not present

## 2024-09-25 DIAGNOSIS — H52203 Unspecified astigmatism, bilateral: Secondary | ICD-10-CM | POA: Diagnosis not present

## 2024-09-25 DIAGNOSIS — K080 Exfoliation of teeth due to systemic causes: Secondary | ICD-10-CM | POA: Diagnosis not present

## 2024-10-10 DIAGNOSIS — R42 Dizziness and giddiness: Secondary | ICD-10-CM

## 2024-10-11 DIAGNOSIS — Y9289 Other specified places as the place of occurrence of the external cause: Secondary | ICD-10-CM | POA: Diagnosis not present

## 2024-10-11 DIAGNOSIS — S0990XA Unspecified injury of head, initial encounter: Secondary | ICD-10-CM | POA: Diagnosis not present

## 2024-10-11 DIAGNOSIS — Y998 Other external cause status: Secondary | ICD-10-CM | POA: Diagnosis not present

## 2024-10-11 DIAGNOSIS — W010XXA Fall on same level from slipping, tripping and stumbling without subsequent striking against object, initial encounter: Secondary | ICD-10-CM | POA: Diagnosis not present

## 2024-10-11 DIAGNOSIS — E041 Nontoxic single thyroid nodule: Secondary | ICD-10-CM | POA: Diagnosis not present

## 2024-10-11 DIAGNOSIS — Y9389 Activity, other specified: Secondary | ICD-10-CM | POA: Diagnosis not present

## 2024-10-11 DIAGNOSIS — S199XXA Unspecified injury of neck, initial encounter: Secondary | ICD-10-CM | POA: Diagnosis not present

## 2024-10-11 DIAGNOSIS — S51812A Laceration without foreign body of left forearm, initial encounter: Secondary | ICD-10-CM | POA: Diagnosis not present

## 2024-10-13 DIAGNOSIS — S41119A Laceration without foreign body of unspecified upper arm, initial encounter: Secondary | ICD-10-CM | POA: Diagnosis not present

## 2024-10-13 DIAGNOSIS — W19XXXA Unspecified fall, initial encounter: Secondary | ICD-10-CM | POA: Diagnosis not present

## 2024-10-21 ENCOUNTER — Ambulatory Visit (HOSPITAL_COMMUNITY)
Admission: RE | Admit: 2024-10-21 | Discharge: 2024-10-21 | Disposition: A | Source: Ambulatory Visit | Attending: Cardiology

## 2024-10-21 ENCOUNTER — Ambulatory Visit (HOSPITAL_COMMUNITY): Admission: RE | Admit: 2024-10-21 | Discharge: 2024-10-21 | Attending: Cardiology

## 2024-10-21 DIAGNOSIS — R42 Dizziness and giddiness: Secondary | ICD-10-CM

## 2024-10-21 DIAGNOSIS — R0602 Shortness of breath: Secondary | ICD-10-CM

## 2024-10-21 DIAGNOSIS — Z1231 Encounter for screening mammogram for malignant neoplasm of breast: Secondary | ICD-10-CM | POA: Diagnosis not present

## 2024-10-21 DIAGNOSIS — I351 Nonrheumatic aortic (valve) insufficiency: Secondary | ICD-10-CM | POA: Diagnosis not present

## 2024-10-21 DIAGNOSIS — I10 Essential (primary) hypertension: Secondary | ICD-10-CM | POA: Diagnosis not present

## 2024-10-21 DIAGNOSIS — I739 Peripheral vascular disease, unspecified: Secondary | ICD-10-CM

## 2024-10-21 DIAGNOSIS — Z681 Body mass index (BMI) 19 or less, adult: Secondary | ICD-10-CM | POA: Diagnosis not present

## 2024-10-21 DIAGNOSIS — Z01419 Encounter for gynecological examination (general) (routine) without abnormal findings: Secondary | ICD-10-CM | POA: Diagnosis not present

## 2024-10-21 DIAGNOSIS — J441 Chronic obstructive pulmonary disease with (acute) exacerbation: Secondary | ICD-10-CM

## 2024-10-21 DIAGNOSIS — M8588 Other specified disorders of bone density and structure, other site: Secondary | ICD-10-CM | POA: Diagnosis not present

## 2024-10-21 DIAGNOSIS — I340 Nonrheumatic mitral (valve) insufficiency: Secondary | ICD-10-CM

## 2024-10-21 LAB — ECHOCARDIOGRAM COMPLETE
Area-P 1/2: 4.52 cm2
S' Lateral: 2.2 cm

## 2024-10-22 ENCOUNTER — Encounter: Payer: Self-pay | Admitting: Pulmonary Disease

## 2024-10-22 NOTE — Telephone Encounter (Signed)
 Patient read message sent via MyChart from Katlyn West, NP 1/12/24/23

## 2024-10-23 ENCOUNTER — Other Ambulatory Visit: Payer: Self-pay

## 2024-10-23 MED ORDER — FLUTICASONE-SALMETEROL 250-50 MCG/ACT IN AEPB: 1.0000 | INHALATION_SPRAY | Freq: Two times a day (BID) | RESPIRATORY_TRACT | 2 refills | Status: DC

## 2024-10-29 ENCOUNTER — Other Ambulatory Visit: Payer: Self-pay | Admitting: Pulmonary Disease

## 2024-10-29 MED ORDER — ALBUTEROL SULFATE HFA 108 (90 BASE) MCG/ACT IN AERS: 2.0000 | INHALATION_SPRAY | RESPIRATORY_TRACT | 5 refills | Status: AC | PRN

## 2024-10-29 NOTE — Progress Notes (Signed)
Albuterol refill sent in

## 2024-11-05 ENCOUNTER — Telehealth: Payer: Self-pay | Admitting: Cardiology

## 2024-11-05 NOTE — Telephone Encounter (Signed)
 Pt would like to know if her appt for 12/29 can be changed to virtual visit due to being 4 hours away. Please advise.

## 2024-11-05 NOTE — Telephone Encounter (Signed)
Duplicate message, please review mychart message.

## 2024-11-11 ENCOUNTER — Ambulatory Visit: Admitting: Cardiology

## 2024-12-12 ENCOUNTER — Other Ambulatory Visit: Payer: Self-pay

## 2024-12-12 MED ORDER — FLUTICASONE-SALMETEROL 250-50 MCG/ACT IN AEPB: 1.0000 | INHALATION_SPRAY | Freq: Two times a day (BID) | RESPIRATORY_TRACT | 3 refills | Status: AC
